# Patient Record
Sex: Male | Born: 1945 | Race: White | Hispanic: No | Marital: Married | Smoking: Former smoker
Health system: Southern US, Community
[De-identification: ages and names within clinical notes are randomized; demographics above are authoritative.]

## PROBLEM LIST (undated history)

## (undated) DIAGNOSIS — E7800 Pure hypercholesterolemia, unspecified: Secondary | ICD-10-CM

## (undated) DIAGNOSIS — M1990 Unspecified osteoarthritis, unspecified site: Secondary | ICD-10-CM

## (undated) DIAGNOSIS — I714 Abdominal aortic aneurysm, without rupture, unspecified: Secondary | ICD-10-CM

## (undated) DIAGNOSIS — M5126 Other intervertebral disc displacement, lumbar region: Secondary | ICD-10-CM

## (undated) DIAGNOSIS — R202 Paresthesia of skin: Secondary | ICD-10-CM

## (undated) DIAGNOSIS — M5020 Other cervical disc displacement, unspecified cervical region: Secondary | ICD-10-CM

## (undated) DIAGNOSIS — F329 Major depressive disorder, single episode, unspecified: Secondary | ICD-10-CM

## (undated) DIAGNOSIS — Z8709 Personal history of other diseases of the respiratory system: Secondary | ICD-10-CM

## (undated) DIAGNOSIS — F32A Depression, unspecified: Secondary | ICD-10-CM

## (undated) DIAGNOSIS — I10 Essential (primary) hypertension: Secondary | ICD-10-CM

## (undated) DIAGNOSIS — G4730 Sleep apnea, unspecified: Secondary | ICD-10-CM

## (undated) DIAGNOSIS — Z8701 Personal history of pneumonia (recurrent): Secondary | ICD-10-CM

## (undated) DIAGNOSIS — H269 Unspecified cataract: Secondary | ICD-10-CM

## (undated) DIAGNOSIS — R200 Anesthesia of skin: Secondary | ICD-10-CM

## (undated) DIAGNOSIS — J302 Other seasonal allergic rhinitis: Secondary | ICD-10-CM

## (undated) HISTORY — PX: ESOPHAGOGASTRODUODENOSCOPY: SHX1529

## (undated) HISTORY — PX: HAMMER TOE SURGERY: SHX385

## (undated) HISTORY — PX: REPLACEMENT TOTAL KNEE BILATERAL: SUR1225

## (undated) HISTORY — PX: EYE SURGERY: SHX253

## (undated) HISTORY — PX: REPLACEMENT TOTAL KNEE: SUR1224

## (undated) HISTORY — PX: COLONOSCOPY: SHX174

---

## 2003-01-17 ENCOUNTER — Inpatient Hospital Stay (HOSPITAL_COMMUNITY): Admission: RE | Admit: 2003-01-17 | Discharge: 2003-01-20 | Payer: Self-pay | Admitting: Orthopedic Surgery

## 2004-01-16 ENCOUNTER — Ambulatory Visit (HOSPITAL_COMMUNITY): Admission: RE | Admit: 2004-01-16 | Discharge: 2004-01-16 | Payer: Self-pay | Admitting: Orthopedic Surgery

## 2004-01-16 ENCOUNTER — Ambulatory Visit (HOSPITAL_BASED_OUTPATIENT_CLINIC_OR_DEPARTMENT_OTHER): Admission: RE | Admit: 2004-01-16 | Discharge: 2004-01-16 | Payer: Self-pay | Admitting: Orthopedic Surgery

## 2005-10-23 ENCOUNTER — Ambulatory Visit: Payer: Self-pay | Admitting: Unknown Physician Specialty

## 2007-01-05 ENCOUNTER — Inpatient Hospital Stay (HOSPITAL_COMMUNITY): Admission: RE | Admit: 2007-01-05 | Discharge: 2007-01-08 | Payer: Self-pay | Admitting: Orthopedic Surgery

## 2007-01-07 ENCOUNTER — Encounter (INDEPENDENT_AMBULATORY_CARE_PROVIDER_SITE_OTHER): Payer: Self-pay | Admitting: Orthopedic Surgery

## 2007-01-07 ENCOUNTER — Ambulatory Visit: Payer: Self-pay | Admitting: Vascular Surgery

## 2007-01-09 ENCOUNTER — Ambulatory Visit: Payer: Self-pay | Admitting: Orthopedic Surgery

## 2010-06-12 NOTE — Op Note (Signed)
NAME:  Maxwell Carroll, Maxwell Carroll                 ACCOUNT NO.:  [id number]   MEDICAL RECORD NO.:  [id number]          PATIENT TYPE:  INP   LOCATION:  2550                         FACILITY:  [facility]   PHYSICIAN:  Feliberto Gottron. Turner Daniels, M.D.   DATE OF BIRTH:  January 26, 1946   DATE OF PROCEDURE:  01/05/2007  DATE OF DISCHARGE:                               OPERATIVE REPORT   PREOPERATIVE DIAGNOSIS:  End-stage arthritis right knee.   POSTOPERATIVE DIAGNOSIS:  End-stage arthritis right knee.   PROCEDURE:  Right total knee arthroplasty using Osteonics Scorpio  components, 13 femur, 13 tibia, 10 mm Scorpio spacer high flex and a 11  patellar button.  All components cemented with a double batch of  Howmedica Simplex cement with 1500 mg Zinacef in the cement.   SURGEON:  Feliberto Gottron.  Turner Daniels, MD   FIRST ASSISTANT:  Skip Mayer PA-C.   ANESTHETIC:  General endotracheal.   ESTIMATED BLOOD LOSS:  Minimal.   FLUID REPLACEMENT:  1200 mL crystalloid.   DRAINS PLACED:  Two medium hemostats.   TOURNIQUET TIME:  1 hour 25 minutes.   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  Plain radiographs show him to be down to  bone-on-bone especially at the patellofemoral joint and medially.  He  has failed conservative treatment with anti-inflammatory medicines,  physical therapy, activity modification and now desires right total knee  arthroplasty in the hopes that he gets a result like he has on the left.  Risks and benefits of surgery discussed, questions answered.   DESCRIPTION OF PROCEDURE:  The patient identified by armband, taken the  operating room at [facility].  Appropriate anesthetic monitors  were attached and general endotracheal anesthesia  induced with the  patient in supine position.  He then received 2 grams of Ancef IV  actually got that in holding area.  Tourniquet was applied high to the  right thigh and lateral post and foot positioner applied to the right  side of the table and the right lower extremity prepped and draped in  usual sterile fashion from the ankle to the tourniquet.  The tourniquet  was inflated to 350 mmHg after wrapping the limb with an Esmarch.  Began  the procedure by making anterior midline incision about 20 cm in length  centered over the patella cutting through the skin and subcutaneous  tissue down to the transverse retinaculum over the patella.  This was  reflected medially allowing for a medial parapatellar arthrotomy.  The  patella was everted, prepatellar fat pad resected with electrocautery.  Superficial medial collateral ligament was elevated from anterior to  posterior leaving intact distally off the tibia over a length of about 4  to 5 cm.  The patella was everted, the knee was hyperflexed exposing the  cruciate ligaments which were then resected with electrocautery as well  as fairly impressive peripheral and notch osteophytes.  These were  removed with an osteotome.  A posterior medial Z retractor was then  placed.  A Homan retractor laterally and a Michael retractor through the  notch.  We then instrumented the proximal tibia with the Osteonics step  drill followed by the intramedullary rod and a 0 degrees posterior slope  cutting guide.  The cut was set up to remove about 6 mm of bone  medially, 8 mm of bone laterally because of the varus configuration of  the knee.  This was accomplished with a power saw after pinning the  cutting guide in place.  We then entered the distal femur 2 mm anterior  to the PCL origin with the intramedullary rod and a 5 degrees right  distal femoral cutting guide was then fixed to the femur, allowing for  10 mm resection from the high point.  We then  sized for a 13 femoral  component and pinned the cutting guide in 3 degrees of external rotation  and performed our anterior-posterior chamfer cuts and Scorpio cuts  without difficulty.  The everted patella was then grasped with patellar  cutting guide and the posterior 10 mm of the patella resected, sized for  #11 patellar component and drilled.  Once again the knee was  hyperflexed.  We sized for a 13 tibial baseplate.  This was pinned in  place so that the center was over the second metatarsal ray and we then  performed our Delta fin keel punches up to 11/13 cemented punch.  At  this point a trial 13 right femoral component was hammered into place.  On the tibial side a 13 trial baseplate with a 10 mm spacer was  inserted.  The knee came to full flexion, full extension and flexed to  130 degrees without restriction and the trial patellar button tracked  normally.  All trial components were then removed, all bony surfaces  were water picked clean, dried with suction and sponges, a double batch  of Howmedica simplex cement with 1500 mg Zinacef was mixed and then  applied to all bony metallic mating surfaces.  In order we hammered into  place a 13 tibial baseplate and removed excess cement, a 13 right  femoral component, removed excess cement and then the 11 mm patellar  button was clamped into place and excess cement removed.  We then  snapped into position the 10 mm bearing, with the 13 base plate.  The  knee was then reduced and held in 5 degrees of flexion with compression  as the cement hardened.  The clamp was removed from the patella.  We  checked tracking one more time.  It was excellent.  Medium Hemovac  drains were placed deep in the wound which was then closed with running  #1 Vicryl suture after one more pass of the pulse lavage.  The  subcutaneous tissue was closed with 0-0 and 2-0 undyed Vicryl suture and  the skin with skin staples.  A dressing of Xeroform, 4x4 dressing   sponges, Webril and Ace wrap applied.  Tourniquet let down.  The patient  was awakened and taken to the recovery room without difficulty.      Feliberto Gottron. Turner Daniels, M.D.  Electronically Signed     FJR/MEDQ  D:  01/05/2007  T:  01/05/2007  Job:  [id number]

## 2010-06-15 NOTE — Discharge Summary (Signed)
NAME:  CYPRESS, FANFAN                             ACCOUNT NO.:  [id number]   MEDICAL RECORD NO.:  [id number]                   PATIENT TYPE:  INP   LOCATION:  5010                                 FACILITY:  [facility]   PHYSICIAN:  Feliberto Gottron. Turner Daniels, M.D.                DATE OF BIRTH:  [birth year]   DATE OF ADMISSION:  01/17/2003  DATE OF DISCHARGE:  01/20/2003                                 DISCHARGE SUMMARY   PRIMARY DIAGNOSIS:  End-stage degenerative joint disease of the left knee.   PROCEDURES:  Left total-knee arthroplasty.   HISTORY OF PRESENT ILLNESS:  The patient is a 65 year old man injured at  work, made worse by a twisting injury of September 08, 2000.  Scope in 2002,  showed positive degenerative changes.  The patient has now failed  conservative treatments including nonsteroidal anti inflammatories, pain  medications, rest, PT, Synvisc injection and bracing.  He wishes to proceed  with left total-knee arthroplasty after discussion of the risk versus  benefits.   The patient has from 15 to 20 degree flexion contracture, with difficulty  ambulating, and is miserable.  He wishes to proceed after all discussions.   MEDICATIONS AT THE TIME OF ADMISSION:  1. Bextra.  2. Vicodin.  3. ___________.   ALLERGIES:  No known drug allergies.   PAST MEDICAL HISTORY:  Usual childhood illness, adult history arthritis.   SURGICAL HISTORY:  Left knee scope in 2002, sinus surgery in 1985, and  difficulties.   SOCIAL HISTORY:  No tobacco, occasional ethanol.  No IV drug abuse.  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   FAMILY HISTORY:  Mother is alive at 38 with positive arthritis.  Father died  at 62.   REVIEW OF SYSTEMS:  Shows positive glasses, no dentures. Denies any  shortness of breath, chest pain or recent illness.   PHYSICAL EXAMINATION:  GENERAL:  The patient is a 6 feet, 1 inch, 285 pound  male.  VITALS INCLUDE:  Temperature 97.5, pulse 92,  respirations 20, blood pressure  158/94.  HEENT:  Head normocephalic, atraumatic.  Ears:  TM's are clear.  Eyes:  Pupils are equal, round and reactive to light and accommodation.  NECK:  Supple.  Full range of motion.  CHEST:  Clear to auscultation and percussion.  HEART:  Regular rate and rhythm.  ABDOMEN:  Soft, nontender, no masses.  Bowel sounds are 2+.  EXTREMITIES:  Left knee has a 10 degree flexion contracture with crepitance.  Trace effusion.  Range of motion 10 to 100 degrees with pain throughout.  SKIN:  Intact.   STUDIES:  X-rays showed bone on bone changes in the medial compartment of  the left knee.   LABORATORY DATA:  Preoperative labs including CBC, CMET, chest x-ray, EKG,  PT and PTT are all within normal limits with the exception of EKG which  did  show possible anterior infarct, age undetermined.  The remainder of the  patient's labs are within normal limits.   HOSPITAL COURSE:  On the day of admission, the patient was taken to the  operating room at [facility] where he underwent a left total-knee  arthroplasty , a number 13 left femur,  number 13 tibial plate, 10 mm  __________ spacer, a number 11 modified medial patellar button___________,  methylmethacrylate cement with 1500 mg of Zinacef imbedded.  Hemovac drain  was placed to the knee.   The patient was placed on perioperative antibiotics for the first 48 hours,  placed on postoperative prophylaxis, __________ postoperatively.  He was  begun on the PCA Dilaudid pump for pain control postoperatively.  Physical  therapy was begun the day of surgery using CPM.   On postoperative day number 1, the patient was without complaint.  He was  out of bed with physical therapy, eating, drinking and voiding without  difficulty.  He was afebrile.  Vital signs were stable.  He was alert and  oriented.  Hemoglobin 11.1, INR 1.1, dressing was dry.  He was  neurovascularly intact to light touch and motor.  The drain  was discontinued  without difficulty.  He continued physical therapy, and PCA was  discontinued.   On postoperative day number 2, the patient was awake and alert, in moderate  pain.  Vital signs were stable.  Hemoglobin was 10.3.  PT 15.3.  Neurovascularly intact.  Otherwise making steady progress.  Continue with  physical therapy and occupational therapy.   On postoperative day number 3, the patient was without complaints.  He was  taking p.o. and voiding without difficulty, ambulating well, over 300 feet  with walker.  Vital signs were stable.  He was afebrile.  Hemoglobin 10.3,  INR 1.6.  Dressing was clean.  Wound was benign.  Abdomen was soft and  nontender.  He was otherwise hemodynamically stable and orthopedically  stable, and improved compared with prior to surgery.  He was discharged home  to the care of his family.   DISCHARGE INSTRUCTIONS:  1. Weightbearing as tolerated with walker and crutches.  Elevate the knee     with ice.  2. Home Health physical therapy for ___________ and CPM.  3. Diet regular.  4. Wound care:  Keep clean and dry, may shower on postoperative day number     7.   DISCHARGE MEDICATIONS:  1. The patient will continue on Coumadin per pharmacy protocol.  2. He was given medication prescriptions for Percocet and Robaxin.   FOLLOWUP:  He will follow up with Dr. Turner Daniels in 7-10 days for followup check.   CONDITION ON DISCHARGE:  Stable and improved.      Laural Benes. Jannet Mantis.                     Feliberto Gottron. Turner Daniels, M.D.    Merita Norton  D:  03/14/2003  T:  03/14/2003  Job:  [id number]

## 2010-06-15 NOTE — Discharge Summary (Signed)
NAME:  Maxwell Carroll, Maxwell Carroll                 ACCOUNT NO.:  [id number]   MEDICAL RECORD NO.:  [id number]          PATIENT TYPE:  INP   LOCATION:  5003                         FACILITY:  [facility]   PHYSICIAN:  Feliberto Gottron. Turner Daniels, M.D.   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 He previously had a left total  knee done and is doing well and desires right total knee arthroplasty.  He has end stage arthritic changes of the knee.   HISTORY OF PRESENT ILLNESS:  Again, the patient had left knee replaced a  year or two ago. Is doing well and now desires the same for the right  knee, which has bone-on-bone arthritic changes. He is well aware of the  risks and benefits of surgery and wants to proceed.   PAST MEDICAL HISTORY:  NO KNOWN DRUG ALLERGIES.   MEDICATIONS:  Include Prozac, vitamins, Vicodin, and Quinine.   PAST MEDICAL HISTORY:  Degenerative arthritis and hypertension.   PAST SURGICAL HISTORY:  1. Left knee scope 2002.  2. Sinus surgery in 1995.   SOCIAL HISTORY:  He does not smoke. He has about 1 drink of alcohol  daily. 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   FAMILY HISTORY:  Positive for his mother who alive at 59. His father  died at 12. He does wear glasses.   REVIEW OF SYSTEMS:  Denies any shortness of breath, chest pain, or  recent illness.   PHYSICAL EXAMINATION:  VITAL SIGNS:  Height 6 feet 1 inches tall. Weight  286 pounds.  EXTREMITIES:  Left total knee scar is well healed. Previous size  included a 13 femur, 13 tibia, #11 patella, 10 mm Swiss Scorpio spacer.  The right knee has end stage arthritis. Range of motion  is 5 to 95  degrees. The skin on the right knee is in good shape.  NEUROLOGIC:  He is otherwise, neurovascularly intact and prepared for  surgical intervention.   HOSPITAL COURSE:  The patient was admitted through pre-admissions on  January 05, 2007. All of his preoperative  labs were in order. He was  taken to the operating room where he underwent right total knee  arthroplasty using a 13 tibial component, 13 right femoral component, 10  mm Scorpio spacer, and a 13 patellar component. Postoperatively, he did  well. First day his hemoglobin was 13.3. He was begun ambulation and  wear bearing as tolerated. We also used a CPM machine. He was seen by  physical therapy and occupational therapy and as he did on his previous  visit, did well with that. He advanced rapidly with physical therapy the  next day and was out in the hallway. His hemoglobin had dropped down to  10.3. He remained afebrile. His wound was clean and dry. His drains were  removed on the first postoperative day and on the third postoperative  day,  he had passed all of his physical therapy and was prepared for  discharge. He did receive home health consult for CPM, maintenance of  his Coumadin for 2 weeks, and was discharged home on Percocet for pain  control and Coumadin on a sliding scale dose adjusted to maintain his  INR between 1.5 and 2. His final hemoglobin was 9.6. White count was 6.5  and at discharge, his range of motion, again only on the third  postoperative day was 0 to 65.   DISPOSITION:  The patient was discharged home on the 11th.   FOLLOWUP:  Return to see me in the office for followup check.   FINAL DIAGNOSES:  End stage arthritis of the right knee.   DISCHARGE MEDICATIONS:  Include those on his home requisition sheet with  the addition of Percocet 5 mg 1 to 2 p.o. q.4 hours p.r.n., #16 with no  refills and Coumadin take as directed, based on a sliding scale.      Feliberto Gottron. Turner Daniels, M.D.  Electronically Signed     FJR/MEDQ  D:  02/19/2007  T:  02/20/2007  Job:  [id number]

## 2010-06-15 NOTE — Op Note (Signed)
NAMEJABRON, WEESE NO.:  [id number]   MEDICAL RECORD NO.:  [id number]                   PATIENT TYPE:  INP   LOCATION:  2887                                 FACILITY:  [facility]   PHYSICIAN:  Feliberto Gottron. Turner Daniels, M.D.                DATE OF BIRTH:  06-11-1945   DATE OF PROCEDURE:  01/17/2003  DATE OF DISCHARGE:                                 OPERATIVE REPORT   PREOPERATIVE DIAGNOSIS:  End-stage arthritis, left knee, with a 15-degree  flexion contracture.   POSTOPERATIVE DIAGNOSIS:  End-stage arthritis, left knee, with a 15-degree  flexion contracture.   PROCEDURE:  Left total knee arthroplasty using Osteonics Scorpio components,  13 left femur, 13 tibial base plate, 10 PS spacer, 11 modified medial  patellar button, all components cemented, a double batch of Palacos  polymethyl methacrylate cement with 1500 mg of Zinacef.   SURGEON:  Feliberto Gottron. Turner Daniels, M.D.   ASSISTANTLaural Benes. Su Hilt, P.A.-C.   ANESTHESIA:  General endotracheal.   ESTIMATED BLOOD LOSS:  Minimal.   FLUIDS REPLACED:  One liter of crystalloid.   DRAINS PLACED:  Two medium Hemovacs.   TOURNIQUET TIME:  One hour and 25 minutes.   INDICATIONS FOR PROCEDURE:  A 65 year old man injured at work some time ago,  who has failed conservative treatment with anti-inflammatory medicines,  physical therapy, arthroscopic debridement, and has end-stage arthritis of  his left knee.  He also has a 15-20 degree flexion contracture and has  difficulty ambulating, and is miserable.  He has also had Visco  supplementation without a positive result.  In any event, he is now prepared  for a total knee arthroplasty with bicompartmental if needed of the left  knee, medial and patellofemoral primarily.  The risks and benefits of  surgery understood by the patient.  He is prepared for surgical  intervention.   DESCRIPTION OF PROCEDURE:  The patient is identified by arm band and taken  to the operating  room at [facility]. [facility] and  appropriate anesthetic monitors were attached.  General endotracheal  anesthesia was induced with the patient in the supine position.  Lateral  posts applied to the table, as well as a foot positioner, and the left lower  extremity was prepped and draped in the usual sterile fashion from the ankle  to the tourniquet which was applied high to the thigh, after the patient had  a femoral nerve block done by anesthesia.  The full limb was wrapped with an  Esmarch bandage with the tourniquet inflated to 350 mmHg.  We began the  procedure by making an anterior midline incision, starting one hand breadth  above the patella, going below the patella, and a couple of cm distal to and  just medial to the tibial tubercle, through the skin and subcutaneous  tissue, down to the level of the transverse retinaculum  which was incised  over the patella and reflected medially, followed by a medial parapatellar  arthrotomy, leaving a cuff of tendon medial at the quad insertion.  We then  peeled the superficial medial collateral ligament off of the proximal tibia,  going from anterior to posterior, and leaving it intact distally over about  4.0 cm.  This allowed [place] to evert the patella, hyperflex the knee, exposing  the knee joint.  We then removed the anterior one-half of the medial and  lateral meniscus as well as the pre-patellar fat pad, and followed by  resection of the cruciate ligaments.  A Z-retractor was placed  posteromedial, a McCale retractor through the notch posteriorly, and a  lateral Hohmann retractor placed, exposing the proximal tibia, which was  then entered with the Osteonics step-drill in the center of the tibial  plateau.  This was followed by the intramedullary rod and a 0-degree  posterior slope cutting guide, allowing a resection of 4.0 mm medially and  about 10.0 mm laterally.  Satisfied with the proximal tibial resection, we  then  entered the distal femur with the step-drill 2.0 mm anterior to the PCL  origin, followed by the IM rod, and a 5-degree left 10.0 mm distal femoral  cutting guide was pinned into place, followed by a standard distal femoral  cut.  We sized for a #13 femoral component and rotated the pins three  degrees to allow for the tight medial flexion gap.  We then performed our  anterior, posterior and chamfer cuts without difficulty, followed by the  Scorpio box cut.  The everted patella was then grasped with the patellar  cutting guide and the posterior 10.0 mm of the patella resected, sized for a  #11 modified medial patellar button and drilled.  The knee was once again  hyperflexed.  We then placed a #13 left trial femur, a #13 trial tibial base  plate with a #16 PS spacer, and found excellent stability in full extension  and flexion of 120 degrees, and good patellar tracking, although a slight  lateral release was required.  At this point the trials were removed, after  marking the rotation of the tibia appropriately.  The knee was again  hyperflexed.  The proximal tibia was exposed with the Z, McCale and Hohmann  retractors.  The #13 tibial base plate was pinned into place where it had  the best coverage and rotation.  The Delta Fit keel punches taken up to an  11, 13 cemented punch.  At this point the knee was water-picked clean and  dried with suction and sponges.  A double batch of Palacos polymethyl  methacrylate cement with 1500 mg of Zinacef was mixed, applied to all bony  and metallic surfaces, except for the posterior condyles of the femur, and  in order we hammered into place a #13 tibial base plate, a #10 left femur, a  #10 PS spacer and a #11 modified medial patellar button was then clamped  into place and the excess cement removed.  The knee was then reduced and  tracking once again checked.  Once again the knee was water-picked clean. The medium Hemovac drains were placed deep in the  wound, after once again  checking for any excess bits of cement.  The parapatellar arthrotomy closed  with running #1 Vicryl suture, the subcutaneous tissue with #0 and #2-0  undyed Vicryl suture, and the skin with skin staples.  A dressing of  Xeroform, 4 x 4 dressings,  sponges, Webril and an Ace wrap were applied.  The patient was then awakened and taken to the recovery room without  difficulty.                                               Feliberto Gottron. Turner Daniels, M.D.    Ovid Curd  D:  01/17/2003  T:  01/17/2003  Job:  [id number]

## 2010-06-15 NOTE — Op Note (Signed)
NAME:  Maxwell Carroll, Maxwell Carroll                 ACCOUNT NO.:  [id number]   MEDICAL RECORD NO.:  [id number]          PATIENT TYPE:  AMB   LOCATION:  DSC                          FACILITY:  [facility]   PHYSICIAN:  Feliberto Gottron. Turner Daniels, M.D.   DATE OF BIRTH:  1946-01-28   DATE OF PROCEDURE:  01/16/2004  DATE OF DISCHARGE:                                 OPERATIVE REPORT   PREOPERATIVE DIAGNOSIS:  Recurrent hemarthrosis, left total knee.   POSTOPERATIVE DIAGNOSES:  1.  Recurrent hemarthrosis, left total knee.  2.  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   PROCEDURE:  Arthroscopic excision of Cyclops lesion and cautery of bleeder  at the base of the Cyclops lesion.   SURGEON:  Feliberto Gottron. Turner Daniels, M.D.   ASSISTANT:  None.   ANESTHESIA:  General LMA.   ESTIMATED BLOOD LOSS:  Minimal.   FLUID REPLACEMENT:  800 cc Crystalloid.   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  He had a total knee done (about one year  ago now), and spontaneously had the onset of a hemarthrosis just walking in  a store in August 2005.  The hemarthrosis has been aspirated three times  now, as much as 180 cc.  Gram stain and cultures have been sent off three  separate times, off of antibiotics and grown nothing.  Because of the  recurrent hemarthrosis, he is taken for arthroscopic evaluation and  treatment, in hopes that we will find what is causing the bleeding in his  knee.  Sometimes after the aspiration there is no bleeding for up to one  week, and then it suddenly comes back over a few hours.   DESCRIPTION OF PROCEDURE:  The patient was identified by arm band, taken to  the operating room at [facility].  Appropriate anesthetic  monitors were attached and general LMA anesthesia induced with the patient  in supine position.  Lateral posts were applied to the table, and the left  lower extremity prepped and draped in the usual sterile fashion  from the  ankle to the mid-thigh.   Using a #11 blade, standard anteromedial and inferolateral parapatellar  portals were then made, allowing introduction of the arthroscope through the  inferolateral portal and the outflow through the anteromedial portal.  Pump  pressure was kept anywhere from 40-70 mmHg.   No obvious bleeder was found in the suprapatellar pouch or in the medial and  lateral gutters of the knee.  Moving into the notch, we identified a Cyclops  lesion which had an obvious impression from the plastic post as the knee  came in to full extension.  When it was removed an obvious bleeder was found  at the base of the Cyclops lesion and cauterized, as was the smaller one.  We then took a 4.2 Grave-White sucker shaver and went around the medial and  lateral gutters, looking for any more obvious bleeders -- none were found.  Looking through the notch, no posterior bleeders were noted.   At this  point, the knee was washed out with normal saline solution.  After  having sent off the original effusion for Gram-stain and culture, the  patient did receive 1 g of Ancef intraoperatively.  Arthroscopic instruments  were removed.  A dressing of Xerofoam 4x4 dressing sponges, Webril and an  Ace wrap applied.  The patient was then awakened and taken to the recovery  room without difficulty.      Emilio Aspen  D:  01/16/2004  T:  01/17/2004  Job:  [id number]

## 2010-11-05 LAB — URINALYSIS, ROUTINE W REFLEX MICROSCOPIC
Bilirubin Urine: NEGATIVE
Glucose, UA: NEGATIVE
Hgb urine dipstick: NEGATIVE
Ketones, ur: NEGATIVE
Nitrite: NEGATIVE
Protein, ur: NEGATIVE
Specific Gravity, Urine: 1.023
Urobilinogen, UA: 0.2
pH: 6.5

## 2010-11-05 LAB — TYPE AND SCREEN
ABO/RH(D): A POS
Antibody Screen: NEGATIVE

## 2010-11-05 LAB — CBC
HCT: 27.9 — ABNORMAL LOW
HCT: 29.4 — ABNORMAL LOW
HCT: 34.7 — ABNORMAL LOW
HCT: 44.3
Hemoglobin: 10.3 — ABNORMAL LOW
Hemoglobin: 11.9 — ABNORMAL LOW
Hemoglobin: 15.1
Hemoglobin: 9.6 — ABNORMAL LOW
MCHC: 34.1
MCHC: 34.3
MCHC: 34.5
MCHC: 34.9
MCV: 88.7
MCV: 89.4
MCV: 89.9
MCV: 89.9
Platelets: 154
Platelets: 155
Platelets: 216
Platelets: 218
RBC: 3.1 — ABNORMAL LOW
RBC: 3.32 — ABNORMAL LOW
RBC: 3.88 — ABNORMAL LOW
RBC: 4.93
RDW: 12.6
RDW: 12.7
RDW: 12.8
RDW: 12.8
WBC: 11.2 — ABNORMAL HIGH
WBC: 6.5
WBC: 6.6
WBC: 7.1

## 2010-11-05 LAB — HEMOGLOBIN AND HEMATOCRIT, BLOOD
HCT: 38.4 — ABNORMAL LOW
HCT: 38.8 — ABNORMAL LOW
Hemoglobin: 13.3
Hemoglobin: 13.6

## 2010-11-05 LAB — COMPREHENSIVE METABOLIC PANEL
ALT: 20
AST: 20
Albumin: 4.2
Alkaline Phosphatase: 56
BUN: 10
CO2: 30
Calcium: 9.8
Chloride: 104
Creatinine, Ser: 0.89
GFR calc Af Amer: >60
GFR calc non Af Amer: >60
Glucose, Bld: 83
Potassium: 4.6
Sodium: 139
Total Bilirubin: 1.1
Total Protein: 7

## 2010-11-05 LAB — DIFFERENTIAL
Basophils Absolute: 0
Basophils Relative: 1
Eosinophils Absolute: 0.3
Eosinophils Relative: 5
Lymphocytes Relative: 32
Lymphs Abs: 2.1
Monocytes Absolute: 0.6
Monocytes Relative: 10
Neutro Abs: 3.5
Neutrophils Relative %: 53

## 2010-11-05 LAB — PREPARE RBC (CROSSMATCH)

## 2010-11-05 LAB — PROTIME-INR
INR: 1
INR: 1.1
INR: 1.4
INR: 1.9 — ABNORMAL HIGH
Prothrombin Time: 13
Prothrombin Time: 14.6
Prothrombin Time: 17.8 — ABNORMAL HIGH
Prothrombin Time: 22.6 — ABNORMAL HIGH

## 2010-11-05 LAB — ABO/RH: ABO/RH(D): A POS

## 2010-11-05 LAB — APTT: aPTT: 27

## 2012-07-01 ENCOUNTER — Other Ambulatory Visit: Payer: Self-pay | Admitting: Unknown Physician Specialty

## 2012-07-01 LAB — SYNOVIAL CELL COUNT + DIFF, W/ CRYSTALS
Basophil: 0 %
Crystals, Joint Fluid: NONE SEEN
Eosinophil: 5 %
Lymphocytes: 11 %
Neutrophils: 84 %
Nucleated Cell Count: 162 /mm3
Other Cells BF: 0 %
Other Mononuclear Cells: 0 %

## 2012-07-05 LAB — MISC AER/ANAEROBIC CULT.

## 2013-01-01 ENCOUNTER — Ambulatory Visit: Payer: Self-pay | Admitting: Internal Medicine

## 2013-01-29 ENCOUNTER — Ambulatory Visit: Payer: Self-pay | Admitting: Internal Medicine

## 2013-10-21 ENCOUNTER — Ambulatory Visit: Payer: Self-pay | Admitting: Internal Medicine

## 2014-01-19 ENCOUNTER — Ambulatory Visit: Payer: Self-pay | Admitting: Podiatry

## 2014-02-28 ENCOUNTER — Emergency Department: Payer: Self-pay | Admitting: Emergency Medicine

## 2014-05-02 ENCOUNTER — Ambulatory Visit
Admit: 2014-05-02 | Disposition: A | Discharge status: Home / Self Care | Payer: Self-pay | Attending: Unknown Physician Specialty | Admitting: Unknown Physician Specialty

## 2014-05-23 LAB — SURGICAL PATHOLOGY

## 2014-12-01 ENCOUNTER — Ambulatory Visit
Admission: RE | Admit: 2014-12-01 | Discharge: 2014-12-01 | Disposition: A | Discharge status: Home / Self Care | Payer: Medicare PPO | Source: Ambulatory Visit | Attending: Internal Medicine | Admitting: Internal Medicine

## 2014-12-01 ENCOUNTER — Other Ambulatory Visit: Payer: Self-pay | Admitting: Internal Medicine

## 2014-12-01 DIAGNOSIS — K5730 Diverticulosis of large intestine without perforation or abscess without bleeding: Secondary | ICD-10-CM | POA: Diagnosis not present

## 2014-12-01 DIAGNOSIS — I714 Abdominal aortic aneurysm, without rupture, unspecified: Secondary | ICD-10-CM

## 2014-12-01 DIAGNOSIS — I7090 Unspecified atherosclerosis: Secondary | ICD-10-CM | POA: Insufficient documentation

## 2014-12-01 DIAGNOSIS — M545 Low back pain, unspecified: Secondary | ICD-10-CM

## 2014-12-01 DIAGNOSIS — K760 Fatty (change of) liver, not elsewhere classified: Secondary | ICD-10-CM | POA: Insufficient documentation

## 2014-12-01 HISTORY — DX: Essential (primary) hypertension: I10

## 2014-12-01 LAB — POCT I-STAT CREATININE: Creatinine, Ser: 1 mg/dL (ref 0.61–1.24)

## 2014-12-01 MED ORDER — IOHEXOL 350 MG/ML SOLN
125.0000 mL | Freq: Once | INTRAVENOUS | Status: AC | PRN
  Administered 2014-12-01: 125 mL via INTRAVENOUS

## 2014-12-06 ENCOUNTER — Ambulatory Visit: Payer: Self-pay | Admitting: Urology

## 2015-01-04 ENCOUNTER — Other Ambulatory Visit: Payer: Self-pay | Admitting: Internal Medicine

## 2015-01-04 DIAGNOSIS — M545 Low back pain, unspecified: Secondary | ICD-10-CM

## 2015-02-01 ENCOUNTER — Other Ambulatory Visit: Payer: Self-pay | Admitting: Neurological Surgery

## 2015-02-01 DIAGNOSIS — M5412 Radiculopathy, cervical region: Secondary | ICD-10-CM

## 2015-02-16 ENCOUNTER — Other Ambulatory Visit: Payer: Self-pay | Admitting: Neurological Surgery

## 2015-02-16 DIAGNOSIS — G930 Cerebral cysts: Secondary | ICD-10-CM

## 2015-02-22 ENCOUNTER — Ambulatory Visit
Admission: RE | Admit: 2015-02-22 | Discharge: 2015-02-22 | Disposition: A | Discharge status: Home / Self Care | Payer: PRIVATE HEALTH INSURANCE | Source: Ambulatory Visit | Attending: Neurological Surgery | Admitting: Neurological Surgery

## 2015-02-22 DIAGNOSIS — G930 Cerebral cysts: Secondary | ICD-10-CM

## 2015-02-22 MED ORDER — GADOBENATE DIMEGLUMINE 529 MG/ML IV SOLN
20.0000 mL | Freq: Once | INTRAVENOUS | Status: AC | PRN
  Administered 2015-02-22: 20 mL via INTRAVENOUS

## 2015-02-24 ENCOUNTER — Other Ambulatory Visit: Payer: Self-pay | Admitting: Neurological Surgery

## 2015-03-06 ENCOUNTER — Emergency Department (HOSPITAL_COMMUNITY): Payer: Medicare Other

## 2015-03-06 ENCOUNTER — Emergency Department (HOSPITAL_COMMUNITY)
Admission: EM | Admit: 2015-03-06 | Discharge: 2015-03-06 | Disposition: A | Discharge status: Home / Self Care | Payer: Medicare Other | Attending: Emergency Medicine | Admitting: Emergency Medicine

## 2015-03-06 ENCOUNTER — Encounter (HOSPITAL_COMMUNITY): Payer: Self-pay

## 2015-03-06 ENCOUNTER — Emergency Department
Admission: EM | Admit: 2015-03-06 | Discharge: 2015-03-06 | Disposition: A | Discharge status: Home / Self Care | Payer: Medicare Other | Attending: Emergency Medicine | Admitting: Emergency Medicine

## 2015-03-06 ENCOUNTER — Encounter: Payer: Self-pay | Admitting: *Deleted

## 2015-03-06 DIAGNOSIS — R05 Cough: Secondary | ICD-10-CM | POA: Diagnosis not present

## 2015-03-06 DIAGNOSIS — R0602 Shortness of breath: Secondary | ICD-10-CM | POA: Insufficient documentation

## 2015-03-06 DIAGNOSIS — I10 Essential (primary) hypertension: Secondary | ICD-10-CM | POA: Insufficient documentation

## 2015-03-06 DIAGNOSIS — Z87891 Personal history of nicotine dependence: Secondary | ICD-10-CM | POA: Diagnosis not present

## 2015-03-06 HISTORY — DX: Unspecified cataract: H26.9

## 2015-03-06 HISTORY — DX: Pure hypercholesterolemia, unspecified: E78.00

## 2015-03-06 HISTORY — DX: Unspecified osteoarthritis, unspecified site: M19.90

## 2015-03-06 HISTORY — DX: Sleep apnea, unspecified: G47.30

## 2015-03-06 LAB — CBC WITH DIFFERENTIAL/PLATELET
BASOS ABS: 0 10*3/uL (ref 0.0–0.1)
Basophils Relative: 0 %
EOS ABS: 0.4 10*3/uL (ref 0.0–0.7)
EOS PCT: 5 %
HCT: 44.7 % (ref 39.0–52.0)
Hemoglobin: 14.7 g/dL (ref 13.0–17.0)
LYMPHS ABS: 1.8 10*3/uL (ref 0.7–4.0)
Lymphocytes Relative: 20 %
MCH: 30 pg (ref 26.0–34.0)
MCHC: 32.9 g/dL (ref 30.0–36.0)
MCV: 91.2 fL (ref 78.0–100.0)
MONO ABS: 0.8 10*3/uL (ref 0.1–1.0)
Monocytes Relative: 9 %
Neutro Abs: 6.1 10*3/uL (ref 1.7–7.7)
Neutrophils Relative %: 66 %
PLATELETS: 240 10*3/uL (ref 150–400)
RBC: 4.9 MIL/uL (ref 4.22–5.81)
RDW: 12.6 % (ref 11.5–15.5)
WBC: 9.2 10*3/uL (ref 4.0–10.5)

## 2015-03-06 LAB — COMPREHENSIVE METABOLIC PANEL
ALT: 26 U/L (ref 17–63)
AST: 22 U/L (ref 15–41)
Albumin: 4 g/dL (ref 3.5–5.0)
Alkaline Phosphatase: 60 U/L (ref 38–126)
Anion gap: 13 (ref 5–15)
BUN: 15 mg/dL (ref 6–20)
CHLORIDE: 97 mmol/L — AB (ref 101–111)
CO2: 27 mmol/L (ref 22–32)
CREATININE: 0.88 mg/dL (ref 0.61–1.24)
Calcium: 9 mg/dL (ref 8.9–10.3)
Glucose, Bld: 97 mg/dL (ref 65–99)
POTASSIUM: 4.6 mmol/L (ref 3.5–5.1)
SODIUM: 137 mmol/L (ref 135–145)
Total Bilirubin: 0.8 mg/dL (ref 0.3–1.2)
Total Protein: 7.3 g/dL (ref 6.5–8.1)

## 2015-03-06 LAB — I-STAT TROPONIN, ED: TROPONIN I, POC: 0 ng/mL (ref 0.00–0.08)

## 2015-03-06 NOTE — ED Notes (Signed)
Pt reports SOB and productive cough of yellow "heavy" mucus, onset yesterday. He sleeps with CPAP at home and the last two nights he reports it is not helping, "its like I'm not getting enough air in." Denies any pain.

## 2015-03-06 NOTE — ED Notes (Addendum)
Pt ambulatory to triage.  Pt has sob and coughing up yellow phlegm.  Sx since yesterday.  Pt scheduled for brain surgery at [place] 03-15-15.  [facility] but did not see a provider due to 5 hour wait time.  Pt denies chest pain. Pt alert.  Speech clear.   Pt reports labs, ekg and cxr done tonight at [place] and pt has stickers and gauze on from needle stick.  However, no labs or er visit found on recent visits.  Dr Corky Downs aware.  Pt decided to leave and not be seen in er tonight and will go to doctor tomorrow morning.

## 2015-03-07 ENCOUNTER — Encounter: Payer: Self-pay | Admitting: *Deleted

## 2015-03-08 ENCOUNTER — Inpatient Hospital Stay (HOSPITAL_COMMUNITY): Admission: RE | Admit: 2015-03-08 | Payer: PRIVATE HEALTH INSURANCE | Source: Ambulatory Visit

## 2015-03-08 ENCOUNTER — Emergency Department (HOSPITAL_COMMUNITY): Payer: Medicare Other

## 2015-03-08 ENCOUNTER — Encounter (HOSPITAL_COMMUNITY): Payer: Medicare Other

## 2015-03-08 ENCOUNTER — Emergency Department (HOSPITAL_COMMUNITY)
Admission: EM | Admit: 2015-03-08 | Discharge: 2015-03-08 | Disposition: A | Discharge status: Home / Self Care | Payer: Medicare Other | Attending: Emergency Medicine | Admitting: Emergency Medicine

## 2015-03-08 ENCOUNTER — Encounter (HOSPITAL_COMMUNITY): Payer: Self-pay | Admitting: Family Medicine

## 2015-03-08 ENCOUNTER — Other Ambulatory Visit: Payer: Self-pay

## 2015-03-08 DIAGNOSIS — Z8739 Personal history of other diseases of the musculoskeletal system and connective tissue: Secondary | ICD-10-CM | POA: Diagnosis not present

## 2015-03-08 DIAGNOSIS — Z8669 Personal history of other diseases of the nervous system and sense organs: Secondary | ICD-10-CM | POA: Insufficient documentation

## 2015-03-08 DIAGNOSIS — R05 Cough: Secondary | ICD-10-CM | POA: Diagnosis present

## 2015-03-08 DIAGNOSIS — Z87891 Personal history of nicotine dependence: Secondary | ICD-10-CM | POA: Insufficient documentation

## 2015-03-08 DIAGNOSIS — E7800 Pure hypercholesterolemia, unspecified: Secondary | ICD-10-CM | POA: Insufficient documentation

## 2015-03-08 DIAGNOSIS — Z79899 Other long term (current) drug therapy: Secondary | ICD-10-CM | POA: Diagnosis not present

## 2015-03-08 DIAGNOSIS — I10 Essential (primary) hypertension: Secondary | ICD-10-CM | POA: Insufficient documentation

## 2015-03-08 DIAGNOSIS — J209 Acute bronchitis, unspecified: Secondary | ICD-10-CM | POA: Insufficient documentation

## 2015-03-08 HISTORY — DX: Other seasonal allergic rhinitis: J30.2

## 2015-03-08 HISTORY — DX: Other cervical disc displacement, unspecified cervical region: M50.20

## 2015-03-08 HISTORY — DX: Abdominal aortic aneurysm, without rupture, unspecified: I71.40

## 2015-03-08 HISTORY — DX: Personal history of other diseases of the respiratory system: Z87.09

## 2015-03-08 HISTORY — DX: Personal history of pneumonia (recurrent): Z87.01

## 2015-03-08 HISTORY — DX: Paresthesia of skin: R20.2

## 2015-03-08 HISTORY — DX: Abdominal aortic aneurysm, without rupture: I71.4

## 2015-03-08 HISTORY — DX: Other intervertebral disc displacement, lumbar region: M51.26

## 2015-03-08 HISTORY — DX: Major depressive disorder, single episode, unspecified: F32.9

## 2015-03-08 HISTORY — DX: Depression, unspecified: F32.A

## 2015-03-08 HISTORY — DX: Anesthesia of skin: R20.0

## 2015-03-08 LAB — BASIC METABOLIC PANEL
ANION GAP: 12 (ref 5–15)
BUN: 13 mg/dL (ref 6–20)
CHLORIDE: 100 mmol/L — AB (ref 101–111)
CO2: 29 mmol/L (ref 22–32)
Calcium: 9.4 mg/dL (ref 8.9–10.3)
Creatinine, Ser: 0.89 mg/dL (ref 0.61–1.24)
GFR calc Af Amer: >60 mL/min (ref >60)
Glucose, Bld: 115 mg/dL — ABNORMAL HIGH (ref 65–99)
POTASSIUM: 4.6 mmol/L (ref 3.5–5.1)
SODIUM: 141 mmol/L (ref 135–145)

## 2015-03-08 LAB — BRAIN NATRIURETIC PEPTIDE: B NATRIURETIC PEPTIDE 5: 15 pg/mL (ref 0.0–100.0)

## 2015-03-08 LAB — CBC
HEMATOCRIT: 46.7 % (ref 39.0–52.0)
HEMOGLOBIN: 15.6 g/dL (ref 13.0–17.0)
MCH: 30.7 pg (ref 26.0–34.0)
MCHC: 33.4 g/dL (ref 30.0–36.0)
MCV: 91.9 fL (ref 78.0–100.0)
Platelets: 238 10*3/uL (ref 150–400)
RBC: 5.08 MIL/uL (ref 4.22–5.81)
RDW: 12.7 % (ref 11.5–15.5)
WBC: 10.2 10*3/uL (ref 4.0–10.5)

## 2015-03-08 LAB — TYPE AND SCREEN
ABO/RH(D): A POS
ANTIBODY SCREEN: NEGATIVE

## 2015-03-08 LAB — I-STAT TROPONIN, ED: Troponin i, poc: 0 ng/mL (ref 0.00–0.08)

## 2015-03-08 MED ORDER — AEROCHAMBER PLUS W/MASK MISC
1.0000 | Freq: Once | Status: AC
  Administered 2015-03-08: 1
  Filled 2015-03-08: qty 1

## 2015-03-08 MED ORDER — ALBUTEROL SULFATE HFA 108 (90 BASE) MCG/ACT IN AERS
2.0000 | INHALATION_SPRAY | RESPIRATORY_TRACT | Status: DC | PRN
Start: 2015-03-08 — End: 2015-03-08
  Administered 2015-03-08: 2 via RESPIRATORY_TRACT
  Filled 2015-03-08: qty 6.7

## 2015-03-08 MED ORDER — ALBUTEROL SULFATE (2.5 MG/3ML) 0.083% IN NEBU
5.0000 mg | INHALATION_SOLUTION | Freq: Once | RESPIRATORY_TRACT | Status: AC
  Administered 2015-03-08: 5 mg via RESPIRATORY_TRACT
  Filled 2015-03-08: qty 6

## 2015-03-08 NOTE — Progress Notes (Signed)
PCP - Dr. Verdell Carmine Cardiologist - Dr. Lujean Amel  EKG - 03/08/15 CXR - 03/08/15  Echo/stress test- requested from Dr. Clayborn Bigness Cardiac Cath - denies  Patient was seen in the ED for shortness of breath and was diagnosed with acute bronchitis.  Patient states that since he received the nebulizer treatment, he feels much better.    Patient denies chest pain.

## 2015-03-08 NOTE — ED Notes (Signed)
Pt back from Xray, in room, and hooked up to monitor.

## 2015-03-08 NOTE — Pre-Procedure Instructions (Signed)
    Maxwell Carroll  03/08/2015      [id number] - [place], [place] - [address] [place] [address] Phone: [phone] Fax: [phone]    Your procedure is scheduled on Wednesday, February 15th, 2017.  Report to [facility] Admitting at 6:30 A.M.  Call this number if you have problems the morning of surgery:  [phone]   Remember:  Do not eat food or drink liquids after midnight.   Take these medicines the morning of surgery with A SIP OF WATER: Fluoxetine (Prozac).  Stop taking: Aspirin, NSAIDS, Aleve, Naproxen, Ibuprofen, Advil, Motrin, BC's, Goody's, Fish oil, all herbal medications, and all vitamins.    Do not wear jewelry.  Do not wear lotions, powders, or colognes.  You may wear deodorant.  Men may shave face and neck.  Do not bring valuables to the hospital.  [facility] is not responsible for any belongings or valuables.  Contacts, dentures or bridgework may not be worn into surgery.  Leave your suitcase in the car.  After surgery it may be brought to your room.  For patients admitted to the hospital, discharge time will be determined by your treatment team.  Patients discharged the day of surgery will not be allowed to drive home.   Special instructions:  See attached.   Please read over the following fact sheets that you were given. Pain Booklet, Coughing and Deep Breathing, Blood Transfusion Information and Surgical Site Infection Prevention

## 2015-03-08 NOTE — Discharge Instructions (Signed)
Acute Bronchitis Use your albuterol inhaler 2 puffs every 4 hours as needed for cough or shortness of breath. Return if needed more than every 4 hours or see your primary care physician. Bronchitis is inflammation of the airways that extend from the windpipe into the lungs (bronchi). The inflammation often causes mucus to develop. This leads to a cough, which is the most common symptom of bronchitis.  In acute bronchitis, the condition usually develops suddenly and goes away over time, usually in a couple weeks. Smoking, allergies, and asthma can make bronchitis worse. Repeated episodes of bronchitis may cause further lung problems.  CAUSES Acute bronchitis is most often caused by the same virus that causes a cold. The virus can spread from person to person (contagious) through coughing, sneezing, and touching contaminated objects. SIGNS AND SYMPTOMS   Cough.   Fever.   Coughing up mucus.   Body aches.   Chest congestion.   Chills.   Shortness of breath.   Sore throat.  DIAGNOSIS  Acute bronchitis is usually diagnosed through a physical exam. Your health care provider will also ask you questions about your medical history. Tests, such as chest X-rays, are sometimes done to rule out other conditions.  TREATMENT  Acute bronchitis usually goes away in a couple weeks. Oftentimes, no medical treatment is necessary. Medicines are sometimes given for relief of fever or cough. Antibiotic medicines are usually not needed but may be prescribed in certain situations. In some cases, an inhaler may be recommended to help reduce shortness of breath and control the cough. A cool mist vaporizer may also be used to help thin bronchial secretions and make it easier to clear the chest.  HOME CARE INSTRUCTIONS  Get plenty of rest.   Drink enough fluids to keep your urine clear or pale yellow (unless you have a medical condition that requires fluid restriction). Increasing fluids may help thin  your respiratory secretions (sputum) and reduce chest congestion, and it will prevent dehydration.   Take medicines only as directed by your health care provider.  If you were prescribed an antibiotic medicine, finish it all even if you start to feel better.  Avoid smoking and secondhand smoke. Exposure to cigarette smoke or irritating chemicals will make bronchitis worse. If you are a smoker, consider using nicotine gum or skin patches to help control withdrawal symptoms. Quitting smoking will help your lungs heal faster.   Reduce the chances of another bout of acute bronchitis by washing your hands frequently, avoiding people with cold symptoms, and trying not to touch your hands to your mouth, nose, or eyes.   Keep all follow-up visits as directed by your health care provider.  SEEK MEDICAL CARE IF: Your symptoms do not improve after 1 week of treatment.  SEEK IMMEDIATE MEDICAL CARE IF:  You develop an increased fever or chills.   You have chest pain.   You have severe shortness of breath.  You have bloody sputum.   You develop dehydration.  You faint or repeatedly feel like you are going to pass out.  You develop repeated vomiting.  You develop a severe headache. MAKE SURE YOU:   Understand these instructions.  Will watch your condition.  Will get help right away if you are not doing well or get worse.   This information is not intended to replace advice given to you by your health care provider. Make sure you discuss any questions you have with your health care provider.   Document Released: 02/22/2004 Document  Revised: 02/04/2014 Document Reviewed: 07/07/2012 Elsevier Interactive Patient Education [employer].

## 2015-03-08 NOTE — ED Notes (Signed)
Pt here with cough, chest pain, SOB and weakness. sts his CPAP was not working last night.

## 2015-03-08 NOTE — ED Provider Notes (Signed)
CSN: [id number]     Arrival date & time 03/08/15  1035 History   First MD Initiated Contact with Patient 03/08/15 1132     Chief Complaint  Patient presents with  . Chest Pain  . Cough     (Consider location/radiation/quality/duration/timing/severity/associated sxs/prior Treatment) HPI Complains of chest pain with cough productive of yellow sputum onset 3 days ago. No fever Feeling as if he cannot get a deep breath. Denies fever denies other associated symptoms. Treated with his wife's albuterol HFA with partial relief. Home CPAP was not helping him last night. No other associated symptoms. Nothing makes symptoms better or worse. Past Medical History  Diagnosis Date  . Sleep apnea   . Hypercholesteremia   . Cataract   . Arthritis   . Hypertension    Past Surgical History  Procedure Laterality Date  . Hammer toe surgery    . Replacement total knee Right   . Replacement total knee bilateral     History reviewed. No pertinent family history. Social History  Substance Use Topics  . Smoking status: Former Smoker    Quit date: 03/05/1988  . Smokeless tobacco: None  . Alcohol Use: Yes    Review of Systems  Constitutional: Negative.   HENT: Negative.   Respiratory: Positive for cough and shortness of breath.   Cardiovascular: Positive for chest pain.  Gastrointestinal: Negative.   Musculoskeletal: Negative.   Skin: Negative.   Neurological: Negative.   Psychiatric/Behavioral: Negative.   All other systems reviewed and are negative.     Allergies  Review of patient's allergies indicates no known allergies.  Home Medications   Prior to Admission medications   Medication Sig Start Date End Date Taking? Authorizing Provider  FLUoxetine (PROZAC) 40 MG capsule Take 40 mg by mouth daily. 02/19/15   Historical Provider, MD  lisinopril (PRINIVIL,ZESTRIL) 10 MG tablet Take 10 mg by mouth daily. 12/21/14   Historical Provider, MD  lovastatin (MEVACOR) 40 MG tablet Take 80 mg by  mouth every evening. 02/08/15   Historical Provider, MD   BP 133/86 mmHg  Pulse 103  Temp(Src) 97.7 F (36.5 C) (Oral)  Resp 17  SpO2 97% Physical Exam  Constitutional: He appears well-developed and well-nourished.  HENT:  Head: Normocephalic and atraumatic.  Eyes: Conjunctivae are normal. Pupils are equal, round, and reactive to light.  Neck: Neck supple. No tracheal deviation present. No thyromegaly present.  Cardiovascular: Normal rate and regular rhythm.   No murmur heard. Pulmonary/Chest: Effort normal. No respiratory distress. He has wheezes.  Expiratory wheeze  Abdominal: Soft. Bowel sounds are normal. He exhibits no distension. There is no tenderness.  Musculoskeletal: Normal range of motion. He exhibits no edema or tenderness.  Neurological: He is alert. Coordination normal.  Skin: Skin is warm and dry. No rash noted.  Psychiatric: He has a normal mood and affect.  Nursing note and vitals reviewed.   ED Course  Procedures (including critical care time) Labs Review Labs Reviewed  BASIC METABOLIC PANEL - Abnormal; Notable for the following:    Chloride 100 (*)    Glucose, Bld 115 (*)    All other components within normal limits  CBC  BRAIN NATRIURETIC PEPTIDE  I-STAT TROPOININ, ED    Imaging Review Dg Chest 2 View  03/06/2015  CLINICAL DATA:  Shortness of breath with productive cough since yesterday. On CPAP at home. History of hypertension. EXAM: CHEST  2 VIEW COMPARISON:  None. FINDINGS: The heart size is normal. There is a possible calcified right  paratracheal lymph node. The mediastinal contours are otherwise normal. The lungs are clear. There is no pleural effusion or pneumothorax. Mild degenerative changes are present throughout the thoracic spine. IMPRESSION: No active cardiopulmonary process. Electronically Signed   By: Richardean Sale M.D.   On: 03/06/2015 18:21   I have personally reviewed and evaluated these images and lab results as part of my medical  decision-making.   EKG Interpretation   Date/Time:  Wednesday March 08 2015 10:46:37 EST Ventricular Rate:  98 PR Interval:  170 QRS Duration: 74 QT Interval:  338 QTC Calculation: 431 R Axis:   -9 Text Interpretation:  Normal sinus rhythm Normal ECG No significant change  since last tracing Confirmed by Hatem Cull  MD, Rosalena Mccorry [phone]) on 03/08/2015  12:15:57 PM     Chest x-ray viewed by me 1:20 PM breathing is normal after one nebulized albuterol treatment. Lungs clear to auscultation he speaks inparagraphs.   She is in no respiratory distress Results for orders placed or performed during the hospital encounter of [id number]  Basic metabolic panel  Result Value Ref Range   Sodium 141 135 - 145 mmol/L   Potassium 4.6 3.5 - 5.1 mmol/L   Chloride 100 (L) 101 - 111 mmol/L   CO2 29 22 - 32 mmol/L   Glucose, Bld 115 (H) 65 - 99 mg/dL   BUN 13 6 - 20 mg/dL   Creatinine, Ser 0.89 0.61 - 1.24 mg/dL   Calcium 9.4 8.9 - 10.3 mg/dL   GFR calc non Af Amer >60 >60 mL/min   GFR calc Af Amer >60 >60 mL/min   Anion gap 12 5 - 15  CBC  Result Value Ref Range   WBC 10.2 4.0 - 10.5 K/uL   RBC 5.08 4.22 - 5.81 MIL/uL   Hemoglobin 15.6 13.0 - 17.0 g/dL   HCT 46.7 39.0 - 52.0 %   MCV 91.9 78.0 - 100.0 fL   MCH 30.7 26.0 - 34.0 pg   MCHC 33.4 30.0 - 36.0 g/dL   RDW 12.7 11.5 - 15.5 %   Platelets 238 150 - 400 K/uL  Brain natriuretic peptide  Result Value Ref Range   B Natriuretic Peptide 15.0 0.0 - 100.0 pg/mL  I-stat troponin, ED (not at [facility], [facility])  Result Value Ref Range   Troponin i, poc 0.00 0.00 - 0.08 ng/mL   Comment 3           Dg Chest 2 View  03/08/2015  CLINICAL DATA:  Shortness of breath for 4 days, productive cough. Sternal pain. EXAM: CHEST  2 VIEW COMPARISON:  None. FINDINGS: Cardiomediastinal silhouette is normal in size and configuration. Lungs are clear. Lung volumes are normal. No evidence of pneumonia. No pleural effusion. No pneumothorax. Mild degenerative change again  noted within the slightly kyphotic thoracic spine, with stable ankylosis. No acute osseous abnormality. Soft tissues about the chest are unremarkable. IMPRESSION: Lungs are clear and there is no evidence of acute cardiopulmonary abnormality. Electronically Signed   By: Franki Cabot M.D.   On: 03/08/2015 11:47   Dg Chest 2 View  03/06/2015  CLINICAL DATA:  Shortness of breath with productive cough since yesterday. On CPAP at home. History of hypertension. EXAM: CHEST  2 VIEW COMPARISON:  None. FINDINGS: The heart size is normal. There is a possible calcified right paratracheal lymph node. The mediastinal contours are otherwise normal. The lungs are clear. There is no pleural effusion or pneumothorax. Mild degenerative changes are present throughout the thoracic spine. IMPRESSION:  No active cardiopulmonary process. Electronically Signed   By: Richardean Sale M.D.   On: 03/06/2015 18:21   Mr Jeri Cos [id number] Contrast  02/23/2015  CLINICAL DATA:  Cerebellar cyst. Abnormal MRI of the cervical spine. Creatinine was obtained on site at [facility] at [address] Results: Creatinine 0.9 mg/dL. EXAM: MRI HEAD WITHOUT AND WITH CONTRAST TECHNIQUE: Multiplanar, multiecho pulse sequences of the brain and surrounding structures were obtained without and with intravenous contrast. CONTRAST:  68mL MULTIHANCE GADOBENATE DIMEGLUMINE 529 MG/ML IV SOLN COMPARISON:  MRI of the cervical spine 02/08/2015 FINDINGS: A solid and cystic posterior fossa mass lesion is demonstrated. There is an enhancing solid nodule is to the left of midline along the superior cerebellar vermis which measures 13 x 7 x 6 mm. A much larger cystic component with a single septation measures 4.4 x 3.5 x 3.5 cm. This creates mass effect on the cerebellum. The cystic component extends to the tentorium. The fourth ventricle is intact. No additional enhancing lesions or cysts are present. No focal supratentorial lesions are present. Mild periventricular  and subcortical T2 changes are within normal limits for age. The brainstem is unremarkable. Internal auditory canals are within normal limits bilaterally. Flow is present in the major intracranial arteries. Bilateral lens replacements are present. The globes and orbits are otherwise intact. Mild mucosal thickening is present along the floor of the maxillary sinuses bilaterally. Asymmetric left-sided ethmoid sinus disease is present. Circumferential mucosal thickening is present in the left frontal sinus. There is fluid level in the left sphenoid sinus. The mastoid air cells are clear. Skullbase is within normal limits. No other focal midline sagittal abnormalities are present. IMPRESSION: 1. 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 The enhancing nodular component is just to the left of midline along the superior cerebellar vermis. 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 The patient is slightly older than the typical demographics. The differential diagnosis includes a cystic metastasis or less likely a cystic GBM. There is no restricted diffusion to support the diagnosis of GBM. 2. No additional lesions. 3. Although there is mass effect along the superior cerebellum, the fourth ventricle is intact and the foramen magnum is clear. There is no downward herniation. 4. Normal appearance of the supratentorial brain. 5. Sinus disease as described. A fluid level is present in the left sphenoid sinus. Electronically Signed   By: San Morelle M.D.   On: 02/23/2015 07:43    MDM  Patient's pulse oximetry is marginal at 8-90% on discharge. I suspect this is baseline for him. Plan albuterol HFA with spacer to go to use 2 puffs every 4 hours when necessary cough or shortness of breath. Return if they have more than every 4 hours or see PMD Diagnosis acute bronchitis Final diagnoses:  None        Orlie Dakin, MD 03/08/15 1329

## 2015-03-09 ENCOUNTER — Encounter (HOSPITAL_COMMUNITY): Payer: Self-pay | Admitting: Emergency Medicine

## 2015-03-09 NOTE — Progress Notes (Addendum)
Anesthesia Chart Review:  Pt is a 70 year old male scheduled for suboccipital craniectomy for tumor resection on 03/15/2015 with Dr. Cyndy Freeze.   PCP is Dr. Ramonita Lab (care everywhere). Cardiologist is Dr. Lujean Amel (care everywhere), last office visit 12/23/13.   PMH includes:  HTN, hyperlipidemia, [name] (on CPAP). Former smoker. BMI 40.   Pt seen in ER 03/08/15 for cough, yellow sputum. CBC, CXR normal. Left voicemail for Janett Billow in Dr. Hewitt Shorts office about pt's probable viral infection.   Medications include: albuterol, lisinopril, lovastatin.   Preoperative labs reviewed.    Chest x-ray 2/8/17reviewed. Lungs are clear and there is no evidence of acute cardiopulmonary abnormality.  EKG 03/08/15: NSR.   Nuclear stress test 11/30/12:  1. Normal treadmill ECG without evidence of ischemia or dysrhythmia 2. Average exercise tolerance for age 30. Mild global LV dysfunction, EF 46% 4. Normal myocardial perfusion images at rest and peak stress without evidence of ischemia.   By notes, pt had an echo 09/2011 with normal LVEF, moderate concentric LVH, moderate MR with mild LAE, normal RV systolic function and pressures, worsening of the mitral valve disease from 2010 study.   Reviewed case with Dr. Kalman Shan.   If no changes, I anticipate pt can proceed with surgery as scheduled.   Willeen Cass, FNP-BC [facility]/Anesthesiology Phone: [phone] 03/10/2015 12:41 PM  Addendum: Janett Billow from Dr. Hewitt Shorts office called. She has follow-up with patient. He says he is feeling much better from his URI. Only reported need for albuterol for two days. Levada Dy already reviewed chart with an anesthesiologist. Patient to be further evaluated on the day of surgery to ensure no concerning cardiopulmonary signs/symptoms prior to proceeding.  George Hugh [facility]/Anesthesiology Phone [phone] 03/13/2015 12:31 PM

## 2015-03-14 MED ORDER — DEXTROSE 5 % IV SOLN
3.0000 g | INTRAVENOUS | Status: AC
  Administered 2015-03-15: 3 g via INTRAVENOUS
  Filled 2015-03-14: qty 3000

## 2015-03-15 ENCOUNTER — Encounter (HOSPITAL_COMMUNITY): Payer: Self-pay | Admitting: *Deleted

## 2015-03-15 ENCOUNTER — Inpatient Hospital Stay (HOSPITAL_COMMUNITY): Payer: Medicare Other | Admitting: Vascular Surgery

## 2015-03-15 ENCOUNTER — Inpatient Hospital Stay (HOSPITAL_COMMUNITY)
Admission: RE | Admit: 2015-03-15 | Discharge: 2015-03-17 | DRG: 026 | Disposition: A | Discharge status: Rehabilitation Facility | Payer: Medicare Other | Source: Ambulatory Visit | Attending: Neurological Surgery | Admitting: Neurological Surgery

## 2015-03-15 ENCOUNTER — Encounter (HOSPITAL_COMMUNITY)
Admission: RE | Disposition: A | Discharge status: Rehabilitation Facility | Payer: Self-pay | Source: Ambulatory Visit | Attending: Neurological Surgery

## 2015-03-15 ENCOUNTER — Inpatient Hospital Stay (HOSPITAL_COMMUNITY): Payer: Medicare Other | Admitting: Emergency Medicine

## 2015-03-15 DIAGNOSIS — F329 Major depressive disorder, single episode, unspecified: Secondary | ICD-10-CM | POA: Diagnosis present

## 2015-03-15 DIAGNOSIS — E7800 Pure hypercholesterolemia, unspecified: Secondary | ICD-10-CM | POA: Diagnosis present

## 2015-03-15 DIAGNOSIS — R270 Ataxia, unspecified: Secondary | ICD-10-CM | POA: Diagnosis not present

## 2015-03-15 DIAGNOSIS — G119 Hereditary ataxia, unspecified: Secondary | ICD-10-CM | POA: Diagnosis present

## 2015-03-15 DIAGNOSIS — G4730 Sleep apnea, unspecified: Secondary | ICD-10-CM | POA: Diagnosis present

## 2015-03-15 DIAGNOSIS — I10 Essential (primary) hypertension: Secondary | ICD-10-CM | POA: Diagnosis present

## 2015-03-15 DIAGNOSIS — N401 Enlarged prostate with lower urinary tract symptoms: Secondary | ICD-10-CM | POA: Diagnosis not present

## 2015-03-15 DIAGNOSIS — E785 Hyperlipidemia, unspecified: Secondary | ICD-10-CM | POA: Diagnosis present

## 2015-03-15 DIAGNOSIS — D72829 Elevated white blood cell count, unspecified: Secondary | ICD-10-CM | POA: Diagnosis not present

## 2015-03-15 DIAGNOSIS — R338 Other retention of urine: Secondary | ICD-10-CM | POA: Diagnosis not present

## 2015-03-15 DIAGNOSIS — G441 Vascular headache, not elsewhere classified: Secondary | ICD-10-CM | POA: Diagnosis not present

## 2015-03-15 DIAGNOSIS — Z96653 Presence of artificial knee joint, bilateral: Secondary | ICD-10-CM | POA: Diagnosis present

## 2015-03-15 DIAGNOSIS — D496 Neoplasm of unspecified behavior of brain: Secondary | ICD-10-CM | POA: Diagnosis present

## 2015-03-15 DIAGNOSIS — G9389 Other specified disorders of brain: Secondary | ICD-10-CM

## 2015-03-15 DIAGNOSIS — R269 Unspecified abnormalities of gait and mobility: Secondary | ICD-10-CM | POA: Diagnosis present

## 2015-03-15 DIAGNOSIS — G939 Disorder of brain, unspecified: Secondary | ICD-10-CM | POA: Diagnosis present

## 2015-03-15 DIAGNOSIS — Z87891 Personal history of nicotine dependence: Secondary | ICD-10-CM

## 2015-03-15 DIAGNOSIS — Z79899 Other long term (current) drug therapy: Secondary | ICD-10-CM

## 2015-03-15 DIAGNOSIS — G4459 Other complicated headache syndrome: Secondary | ICD-10-CM | POA: Diagnosis not present

## 2015-03-15 HISTORY — PX: CRANIOTOMY: SHX93

## 2015-03-15 HISTORY — PX: APPLICATION OF CRANIAL NAVIGATION: SHX6578

## 2015-03-15 LAB — MRSA PCR SCREENING: MRSA BY PCR: NEGATIVE

## 2015-03-15 SURGERY — CRANIOTOMY TUMOR EXCISION
Anesthesia: General | Site: Head

## 2015-03-15 MED ORDER — ROCURONIUM BROMIDE 50 MG/5ML IV SOLN
INTRAVENOUS | Status: AC
  Filled 2015-03-15: qty 1

## 2015-03-15 MED ORDER — ROCURONIUM BROMIDE 100 MG/10ML IV SOLN
INTRAVENOUS | Status: DC | PRN
  Administered 2015-03-15: 20 mg via INTRAVENOUS
  Administered 2015-03-15 (×2): 50 mg via INTRAVENOUS
  Administered 2015-03-15 (×2): 10 mg via INTRAVENOUS
  Administered 2015-03-15: 20 mg via INTRAVENOUS

## 2015-03-15 MED ORDER — HYDROCODONE-ACETAMINOPHEN 5-325 MG PO TABS
1.0000 | ORAL_TABLET | ORAL | Status: DC | PRN
Start: 2015-03-15 — End: 2015-03-17
  Administered 2015-03-16 – 2015-03-17 (×6): 1 via ORAL
  Filled 2015-03-15 (×6): qty 1

## 2015-03-15 MED ORDER — SODIUM CHLORIDE 0.9 % IJ SOLN
INTRAMUSCULAR | Status: AC
  Filled 2015-03-15: qty 10

## 2015-03-15 MED ORDER — HYDROMORPHONE HCL 1 MG/ML IJ SOLN
INTRAMUSCULAR | Status: AC
  Filled 2015-03-15: qty 1

## 2015-03-15 MED ORDER — ONDANSETRON HCL 4 MG PO TABS
4.0000 mg | ORAL_TABLET | ORAL | Status: DC | PRN
Start: 2015-03-15 — End: 2015-03-17

## 2015-03-15 MED ORDER — LIDOCAINE HCL (CARDIAC) 20 MG/ML IV SOLN
INTRAVENOUS | Status: DC | PRN
  Administered 2015-03-15: 100 mg via INTRAVENOUS

## 2015-03-15 MED ORDER — EPHEDRINE SULFATE 50 MG/ML IJ SOLN
INTRAMUSCULAR | Status: AC
  Filled 2015-03-15: qty 1

## 2015-03-15 MED ORDER — ROCURONIUM BROMIDE 50 MG/5ML IV SOLN
INTRAVENOUS | Status: AC
  Filled 2015-03-15: qty 2

## 2015-03-15 MED ORDER — MANNITOL 25 % IV SOLN
INTRAVENOUS | Status: DC | PRN
  Administered 2015-03-15: 50 g via INTRAVENOUS

## 2015-03-15 MED ORDER — PROPOFOL 10 MG/ML IV BOLUS
INTRAVENOUS | Status: DC | PRN
  Administered 2015-03-15: 150 mg via INTRAVENOUS
  Administered 2015-03-15 (×2): 50 mg via INTRAVENOUS

## 2015-03-15 MED ORDER — SODIUM CHLORIDE 0.9 % IV SOLN
INTRAVENOUS | Status: DC | PRN
  Administered 2015-03-15 (×3): via INTRAVENOUS

## 2015-03-15 MED ORDER — SENNA 8.6 MG PO TABS
1.0000 | ORAL_TABLET | Freq: Two times a day (BID) | ORAL | Status: DC
  Administered 2015-03-16 – 2015-03-17 (×3): 8.6 mg via ORAL
  Filled 2015-03-15 (×3): qty 1

## 2015-03-15 MED ORDER — MIDAZOLAM HCL 2 MG/2ML IJ SOLN
INTRAMUSCULAR | Status: AC
  Filled 2015-03-15: qty 2

## 2015-03-15 MED ORDER — THROMBIN 5000 UNITS EX SOLR
CUTANEOUS | Status: DC | PRN
  Administered 2015-03-15: 10 mL via TOPICAL

## 2015-03-15 MED ORDER — LIDOCAINE-EPINEPHRINE 2 %-1:100000 IJ SOLN
INTRAMUSCULAR | Status: DC | PRN
  Administered 2015-03-15: 15 mL via INTRADERMAL

## 2015-03-15 MED ORDER — SUGAMMADEX SODIUM 200 MG/2ML IV SOLN
INTRAVENOUS | Status: AC
  Filled 2015-03-15: qty 2

## 2015-03-15 MED ORDER — NALOXONE HCL 0.4 MG/ML IJ SOLN: 0.0800 mg | INTRAMUSCULAR | Status: DC | PRN

## 2015-03-15 MED ORDER — MEPERIDINE HCL 25 MG/ML IJ SOLN: 6.2500 mg | INTRAMUSCULAR | Status: DC | PRN

## 2015-03-15 MED ORDER — SODIUM CHLORIDE 0.9 % IR SOLN
Status: DC | PRN
  Administered 2015-03-15: 500 mL

## 2015-03-15 MED ORDER — THROMBIN 20000 UNITS EX SOLR
CUTANEOUS | Status: DC | PRN
  Administered 2015-03-15: 20 mL via TOPICAL

## 2015-03-15 MED ORDER — LISINOPRIL 10 MG PO TABS
10.0000 mg | ORAL_TABLET | Freq: Every day | ORAL | Status: DC
  Administered 2015-03-17: 10 mg via ORAL
  Filled 2015-03-15: qty 1

## 2015-03-15 MED ORDER — PHENYLEPHRINE 40 MCG/ML (10ML) SYRINGE FOR IV PUSH (FOR BLOOD PRESSURE SUPPORT)
PREFILLED_SYRINGE | INTRAVENOUS | Status: AC
  Filled 2015-03-15: qty 10

## 2015-03-15 MED ORDER — FENTANYL CITRATE (PF) 250 MCG/5ML IJ SOLN
INTRAMUSCULAR | Status: AC
  Filled 2015-03-15: qty 5

## 2015-03-15 MED ORDER — HYDROMORPHONE HCL 1 MG/ML IJ SOLN
0.2500 mg | INTRAMUSCULAR | Status: DC | PRN
  Administered 2015-03-15 (×4): 0.5 mg via INTRAVENOUS

## 2015-03-15 MED ORDER — ONDANSETRON HCL 4 MG/2ML IJ SOLN
4.0000 mg | INTRAMUSCULAR | Status: DC | PRN
  Administered 2015-03-16: 4 mg via INTRAVENOUS
  Filled 2015-03-15: qty 2

## 2015-03-15 MED ORDER — PROMETHAZINE HCL 25 MG PO TABS: 12.5000 mg | ORAL_TABLET | ORAL | Status: DC | PRN

## 2015-03-15 MED ORDER — ONDANSETRON HCL 4 MG/2ML IJ SOLN
INTRAMUSCULAR | Status: AC
  Filled 2015-03-15: qty 2

## 2015-03-15 MED ORDER — LABETALOL HCL 5 MG/ML IV SOLN
INTRAVENOUS | Status: DC | PRN
  Administered 2015-03-15: 10 mg via INTRAVENOUS

## 2015-03-15 MED ORDER — SODIUM CHLORIDE 0.9 % IV SOLN
INTRAVENOUS | Status: DC
  Administered 2015-03-15 – 2015-03-17 (×2): via INTRAVENOUS

## 2015-03-15 MED ORDER — DOCUSATE SODIUM 100 MG PO CAPS
100.0000 mg | ORAL_CAPSULE | Freq: Two times a day (BID) | ORAL | Status: DC
  Administered 2015-03-16 – 2015-03-17 (×3): 100 mg via ORAL
  Filled 2015-03-15 (×3): qty 1

## 2015-03-15 MED ORDER — CEFAZOLIN SODIUM-DEXTROSE 2-3 GM-% IV SOLR
2.0000 g | Freq: Three times a day (TID) | INTRAVENOUS | Status: AC
  Administered 2015-03-15 – 2015-03-16 (×2): 2 g via INTRAVENOUS
  Filled 2015-03-15 (×2): qty 50

## 2015-03-15 MED ORDER — SUGAMMADEX SODIUM 200 MG/2ML IV SOLN
INTRAVENOUS | Status: DC | PRN
  Administered 2015-03-15: 200 mg via INTRAVENOUS

## 2015-03-15 MED ORDER — MIDAZOLAM HCL 5 MG/5ML IJ SOLN
INTRAMUSCULAR | Status: DC | PRN
  Administered 2015-03-15 (×2): 1 mg via INTRAVENOUS

## 2015-03-15 MED ORDER — PROMETHAZINE HCL 25 MG/ML IJ SOLN
6.2500 mg | Freq: Once | INTRAMUSCULAR | Status: AC
  Administered 2015-03-15: 6.25 mg via INTRAVENOUS

## 2015-03-15 MED ORDER — DEXAMETHASONE SODIUM PHOSPHATE 10 MG/ML IJ SOLN
INTRAMUSCULAR | Status: DC | PRN
  Administered 2015-03-15: 10 mg via INTRAVENOUS

## 2015-03-15 MED ORDER — CEFAZOLIN SODIUM 1-5 GM-% IV SOLN
INTRAVENOUS | Status: AC
Start: 2015-03-15 — End: 2015-03-15
  Filled 2015-03-15: qty 50

## 2015-03-15 MED ORDER — ALBUTEROL SULFATE (2.5 MG/3ML) 0.083% IN NEBU: 3.0000 mL | INHALATION_SOLUTION | RESPIRATORY_TRACT | Status: DC | PRN

## 2015-03-15 MED ORDER — PROPOFOL 10 MG/ML IV BOLUS
INTRAVENOUS | Status: AC
  Filled 2015-03-15: qty 20

## 2015-03-15 MED ORDER — HYDRALAZINE HCL 20 MG/ML IJ SOLN
INTRAMUSCULAR | Status: AC
  Filled 2015-03-15: qty 1

## 2015-03-15 MED ORDER — ONDANSETRON HCL 4 MG/2ML IJ SOLN
INTRAMUSCULAR | Status: DC | PRN
  Administered 2015-03-15: 4 mg via INTRAVENOUS

## 2015-03-15 MED ORDER — 0.9 % SODIUM CHLORIDE (POUR BTL) OPTIME
TOPICAL | Status: DC | PRN
  Administered 2015-03-15 (×3): 1000 mL

## 2015-03-15 MED ORDER — PANTOPRAZOLE SODIUM 40 MG IV SOLR
40.0000 mg | Freq: Every day | INTRAVENOUS | Status: DC
  Administered 2015-03-15 – 2015-03-16 (×2): 40 mg via INTRAVENOUS
  Filled 2015-03-15 (×2): qty 40

## 2015-03-15 MED ORDER — LIDOCAINE HCL (CARDIAC) 20 MG/ML IV SOLN
INTRAVENOUS | Status: AC
  Filled 2015-03-15: qty 5

## 2015-03-15 MED ORDER — FENTANYL CITRATE (PF) 100 MCG/2ML IJ SOLN
INTRAMUSCULAR | Status: DC | PRN
  Administered 2015-03-15: 250 ug via INTRAVENOUS

## 2015-03-15 MED ORDER — NICARDIPINE HCL IN NACL 20-0.86 MG/200ML-% IV SOLN
3.0000 mg/h | INTRAVENOUS | Status: DC
  Filled 2015-03-15: qty 200

## 2015-03-15 MED ORDER — ONDANSETRON HCL 4 MG/2ML IJ SOLN
4.0000 mg | Freq: Once | INTRAMUSCULAR | Status: AC | PRN
  Administered 2015-03-15: 4 mg via INTRAVENOUS

## 2015-03-15 MED ORDER — FLEET ENEMA 7-19 GM/118ML RE ENEM: 1.0000 | ENEMA | Freq: Once | RECTAL | Status: DC | PRN

## 2015-03-15 MED ORDER — PRAVASTATIN SODIUM 20 MG PO TABS
10.0000 mg | ORAL_TABLET | Freq: Every day | ORAL | Status: DC
  Administered 2015-03-16 – 2015-03-17 (×2): 10 mg via ORAL
  Filled 2015-03-15 (×2): qty 1

## 2015-03-15 MED ORDER — BISACODYL 5 MG PO TBEC: 5.0000 mg | DELAYED_RELEASE_TABLET | Freq: Every day | ORAL | Status: DC | PRN

## 2015-03-15 MED ORDER — FLUOXETINE HCL 20 MG PO CAPS
40.0000 mg | ORAL_CAPSULE | Freq: Every day | ORAL | Status: DC
  Administered 2015-03-16 – 2015-03-17 (×2): 40 mg via ORAL
  Filled 2015-03-15 (×2): qty 2

## 2015-03-15 MED ORDER — PHENYLEPHRINE HCL 10 MG/ML IJ SOLN
10.0000 mg | INTRAVENOUS | Status: DC | PRN
  Administered 2015-03-15: 40 ug/min via INTRAVENOUS

## 2015-03-15 MED ORDER — HYDROMORPHONE HCL 1 MG/ML IJ SOLN
0.5000 mg | INTRAMUSCULAR | Status: DC | PRN
  Administered 2015-03-15 – 2015-03-17 (×11): 1 mg via INTRAVENOUS
  Filled 2015-03-15 (×11): qty 1

## 2015-03-15 MED ORDER — PROMETHAZINE HCL 25 MG/ML IJ SOLN
INTRAMUSCULAR | Status: AC
  Administered 2015-03-15: 6.25 mg
  Filled 2015-03-15: qty 1

## 2015-03-15 MED ORDER — FUROSEMIDE 10 MG/ML IJ SOLN
INTRAMUSCULAR | Status: DC | PRN
  Administered 2015-03-15: 10 mg via INTRAMUSCULAR

## 2015-03-15 MED ORDER — HYDRALAZINE HCL 20 MG/ML IJ SOLN
5.0000 mg | INTRAMUSCULAR | Status: DC | PRN
  Administered 2015-03-15 (×2): 10 mg via INTRAVENOUS
  Filled 2015-03-15: qty 1

## 2015-03-15 MED ORDER — BUPIVACAINE-EPINEPHRINE (PF) 0.5% -1:200000 IJ SOLN
INTRAMUSCULAR | Status: DC | PRN
  Administered 2015-03-15: 15 mL via PERINEURAL

## 2015-03-15 MED ORDER — SODIUM CHLORIDE 0.9 % IV SOLN
0.0500 ug/kg/min | INTRAVENOUS | Status: DC
  Administered 2015-03-15: 0.1 ug/kg/min via INTRAVENOUS
  Filled 2015-03-15: qty 5000

## 2015-03-15 MED ORDER — MICROFIBRILLAR COLL HEMOSTAT EX PADS
MEDICATED_PAD | CUTANEOUS | Status: DC | PRN
Start: 2015-03-15 — End: 2015-03-15
  Administered 2015-03-15: 1 via TOPICAL

## 2015-03-15 SURGICAL SUPPLY — 88 items
APL SKNCLS STERI-STRIP NONHPOA (GAUZE/BANDAGES/DRESSINGS)
APL SRG 60D 8 XTD TIP BNDBL (TIP) ×2
APPLICATOR CHLORAPREP 3ML ORNG (MISCELLANEOUS) ×4 IMPLANT
BENZOIN TINCTURE PRP APPL 2/3 (GAUZE/BANDAGES/DRESSINGS) IMPLANT
BLADE CLIPPER SURG (BLADE) ×4 IMPLANT
BLADE ULTRA TIP 2M (BLADE) ×4 IMPLANT
BNDG GAUZE ELAST 4 BULKY (GAUZE/BANDAGES/DRESSINGS) IMPLANT
BRUSH SCRUB EZ 1% IODOPHOR (MISCELLANEOUS) ×4 IMPLANT
BUR ACORN 6.0 PRECISION (BURR) ×3 IMPLANT
BUR ACORN 6.0MM PRECISION (BURR) ×1
BUR ADDG 1.1 (BURR) IMPLANT
BUR ADDG 1.1MM (BURR)
BUR MATCHSTICK NEURO 3.0 LAGG (BURR) ×2 IMPLANT
BUR RND DIAMOND ELITE 4.0 (BURR) ×1 IMPLANT
BUR RND DIAMOND ELITE 4.0MM (BURR) ×1
BUR ROUND FLUTED 5 RND (BURR) ×1 IMPLANT
BUR ROUND FLUTED 5MM RND (BURR) ×1
BUR SPIRAL ROUTER 2.3 (BUR) IMPLANT
BUR SPIRAL ROUTER 2.3MM (BUR)
CANISTER SUCT 3000ML PPV (MISCELLANEOUS) ×4 IMPLANT
CATH ROBINSON RED A/P 14FR (CATHETERS) IMPLANT
CLIP TI MEDIUM 6 (CLIP) IMPLANT
DRAIN SNY WOU 7FLT (WOUND CARE) IMPLANT
DRAPE NEUROLOGICAL W/INCISE (DRAPES) ×4 IMPLANT
DRAPE SHEET LG 3/4 BI-LAMINATE (DRAPES) ×8 IMPLANT
DRAPE SURG 17X23 STRL (DRAPES) IMPLANT
DRAPE WARM FLUID 44X44 (DRAPE) ×4 IMPLANT
DRSG MEPILEX BORDER 4X12 (GAUZE/BANDAGES/DRESSINGS) ×2 IMPLANT
DURAMATRIX ONLAY 3X3 (Plate) ×2 IMPLANT
DURASEAL APPLICATOR TIP (TIP) ×2 IMPLANT
DURASEAL SPINE SEALANT 3ML (MISCELLANEOUS) ×2 IMPLANT
ELECT REM PT RETURN 9FT ADLT (ELECTROSURGICAL) ×4
ELECTRODE REM PT RTRN 9FT ADLT (ELECTROSURGICAL) ×2 IMPLANT
EVACUATOR 1/8 PVC DRAIN (DRAIN) IMPLANT
EVACUATOR SILICONE 100CC (DRAIN) IMPLANT
GAUZE SPONGE 4X4 12PLY STRL (GAUZE/BANDAGES/DRESSINGS) ×4 IMPLANT
GAUZE SPONGE 4X4 16PLY XRAY LF (GAUZE/BANDAGES/DRESSINGS) IMPLANT
GLOVE BIOGEL PI IND STRL 7.5 (GLOVE) ×2 IMPLANT
GLOVE BIOGEL PI INDICATOR 7.5 (GLOVE) ×2
GLOVE EXAM NITRILE LRG STRL (GLOVE) ×4 IMPLANT
GLOVE EXAM NITRILE MD LF STRL (GLOVE) IMPLANT
GLOVE EXAM NITRILE XL STR (GLOVE) IMPLANT
GLOVE EXAM NITRILE XS STR PU (GLOVE) IMPLANT
GLOVE SS BIOGEL STRL SZ 7 (GLOVE) ×4 IMPLANT
GLOVE SUPERSENSE BIOGEL SZ 7 (GLOVE) ×4
GOWN STRL REUS W/ TWL LRG LVL3 (GOWN DISPOSABLE) ×2 IMPLANT
GOWN STRL REUS W/ TWL XL LVL3 (GOWN DISPOSABLE) IMPLANT
GOWN STRL REUS W/TWL 2XL LVL3 (GOWN DISPOSABLE) IMPLANT
GOWN STRL REUS W/TWL LRG LVL3 (GOWN DISPOSABLE) ×4
GOWN STRL REUS W/TWL XL LVL3 (GOWN DISPOSABLE)
HEMOSTAT POWDER KIT SURGIFOAM (HEMOSTASIS) ×4 IMPLANT
HEMOSTAT SURGICEL 2X14 (HEMOSTASIS) IMPLANT
KIT BASIN OR (CUSTOM PROCEDURE TRAY) ×4 IMPLANT
KIT ROOM TURNOVER OR (KITS) ×4 IMPLANT
NDL HYPO 21X1.5 SAFETY (NEEDLE) ×2 IMPLANT
NDL HYPO 25X1 1.5 SAFETY (NEEDLE) ×2 IMPLANT
NEEDLE HYPO 21X1.5 SAFETY (NEEDLE) ×4 IMPLANT
NEEDLE HYPO 25X1 1.5 SAFETY (NEEDLE) ×4 IMPLANT
NS IRRIG 1000ML POUR BTL (IV SOLUTION) ×8 IMPLANT
PACK CRANIOTOMY (CUSTOM PROCEDURE TRAY) ×4 IMPLANT
PATTIES SURGICAL .5 X.5 (GAUZE/BANDAGES/DRESSINGS) ×2 IMPLANT
PATTIES SURGICAL .5 X3 (DISPOSABLE) IMPLANT
PATTIES SURGICAL 1X1 (DISPOSABLE) IMPLANT
PIN MAYFIELD SKULL DISP (PIN) ×4 IMPLANT
SPONGE NEURO XRAY DETECT 1X3 (DISPOSABLE) IMPLANT
SPONGE SURGIFOAM ABS GEL 100 (HEMOSTASIS) ×4 IMPLANT
SPONGE SURGIFOAM ABS GEL 100C (HEMOSTASIS) ×4 IMPLANT
STAPLER VISISTAT 35W (STAPLE) ×4 IMPLANT
STOCKINETTE 6  STRL (DRAPES) ×2
STOCKINETTE 6 STRL (DRAPES) ×2 IMPLANT
SUT ETHILON 2 0 FS 18 (SUTURE) ×2 IMPLANT
SUT ETHILON 3 0 FSL (SUTURE) IMPLANT
SUT ETHILON 3 0 PS 1 (SUTURE) IMPLANT
SUT NURALON 4 0 TR CR/8 (SUTURE) ×8 IMPLANT
SUT VIC AB 0 CT1 18XCR BRD8 (SUTURE) ×4 IMPLANT
SUT VIC AB 0 CT1 8-18 (SUTURE) ×8
SUT VIC AB 2-0 CT1 18 (SUTURE) ×8 IMPLANT
SUT VIC AB 3-0 SH 8-18 (SUTURE) ×8 IMPLANT
SYR 30ML LL (SYRINGE) ×8 IMPLANT
TIP NONSTICK .5MMX23CM (INSTRUMENTS) ×4
TIP NONSTICK .5X23 (INSTRUMENTS) IMPLANT
TOWEL OR 17X24 6PK STRL BLUE (TOWEL DISPOSABLE) ×4 IMPLANT
TOWEL OR 17X26 10 PK STRL BLUE (TOWEL DISPOSABLE) ×4 IMPLANT
TRAY FOLEY W/METER SILVER 14FR (SET/KITS/TRAYS/PACK) ×4 IMPLANT
TUBE CONNECTING 12'X1/4 (SUCTIONS) ×1
TUBE CONNECTING 12X1/4 (SUCTIONS) ×3 IMPLANT
UNDERPAD 30X30 INCONTINENT (UNDERPADS AND DIAPERS) ×4 IMPLANT
WATER STERILE IRR 1000ML POUR (IV SOLUTION) ×4 IMPLANT

## 2015-03-15 NOTE — Progress Notes (Deleted)
03/15/2015  8:04 PM  PATIENT:  Maxwell Carroll  70 y.o. male  PRE-OPERATIVE DIAGNOSIS:  Left superior cerebellar cystic mass  POST-OPERATIVE DIAGNOSIS:  Same  PROCEDURE:  Suboccipital craniectomy for tumor resection, use of stereotactic navigation  SURGEON:  Aldean Ast, MD  ASSISTANTS: Kary Kos, MD  ANESTHESIA:   General  DRAINS: None  SPECIMEN:  Cerebellar mass  INDICATION FOR PROCEDURE: 70 year old man with incidentally discovered cerebellar cystic mass.  I recommended resection. Patient understood the risks, benefits, and alternatives and potential outcomes and wished to proceed.  PROCEDURE DETAILS: After smooth induction of general endotracheal anesthesia the patient was placed in [place] pins and turned prone onto chest rolls. The head was secured to the operating table. Intraoperative stereotactic navigation was then registered and it's accuracy confirmed. The skin was wiped down with alcohol. The planned incision was determined using stereotactic navigation. The area of the planned incision was anesthetized with lidocaine and Marcaine with epinephrine. The patient was then prepped and draped in the usual sterile fashion.  A linear incision was made along the midline in the suboccipital area and extended above the inion. Subperiosteal dissection was performed to expose the suboccipital area. Using a high-speed bur and Kerrison rongeurs a sizable craniectomy was made which approached the torcula and transverse sinuses bilaterally.  I sharply open the dura with a Y-shaped incision and reflected the superior flap towards the torcula. I resected a small amount of left cerebellar hemisphere cortex adjacent to the Vermis and dissected deep to the surface. Encountered the cyst which was opened with bipolar and suction.  Using stereotactic navigation I was able to identify the tumor nodule. Using microsurgical technique and bipolar cautery and pituitary punches I resected the  tumor. Initial specimens were sent for frozen section analysis which were consistent with lesional tissue.   Hemostasis was obtained.  I irrigated with bacitracin saline. I closed the inferior limb of the dural incision primarily. The superior limbs could not be closed. I placed an inlay and onlay of DuraMatrix. I then sealed this with DuraSeal. I irrigated again with bacitracin saline. The wound was then closed in routine anatomic layers with interrupted Vicryl sutures. The skin was closed with a running locked nylon suture. A sterile occlusive dressing was placed. The patient was returned prone to the stretcher in the [place] pins were removed. He awoke uneventfully.   PATIENT DISPOSITION:  PACU then ICU   Delay start of Pharmacological VTE agent (>24hrs) due to surgical blood loss or risk of bleeding:  Yes

## 2015-03-15 NOTE — Progress Notes (Signed)
Awake and alert Moving all extremities Follows commands x4 Cardene gtt to keep SBP < 150

## 2015-03-15 NOTE — Brief Op Note (Signed)
03/15/2015  1:40 PM  PATIENT:  Maxwell Carroll  70 y.o. male  PRE-OPERATIVE DIAGNOSIS:  Neoplasm of cerebellum  POST-OPERATIVE DIAGNOSIS:  Neoplasm of cerebellum  PROCEDURE:  Procedure(s) with comments: Suboccipital craniectomy for tumor resection (N/A) - Suboccipital craniectomy for tumor resection APPLICATION OF CRANIAL NAVIGATION - APPLICATION OF CRANIAL NAVIGATION  SURGEON:  Surgeon(s) and Role:    * Tamala Fothergill, MD - Primary    * Kary Kos, MD - Assisting  PHYSICIAN ASSISTANT:   ASSISTANTS: Kary Kos, MD  ANESTHESIA:   GETA  EBL:  Total I/O In: 2500 [I.V.:2500] Out: 1600 [Urine:1200; Blood:400]  BLOOD ADMINISTERED: None  DRAINS: None  LOCAL MEDICATIONS USED:  Lidocaine and marcaine with epi  SPECIMEN: cerebellar mass  DISPOSITION OF SPECIMEN:  pathology  COUNTS: Correct  TOURNIQUET:  * No tourniquets in log *  DICTATION: .Dragon  PLAN OF CARE: Admit to inpatient   PATIENT DISPOSITION:  PACU - hemodynamically stable.   Delay start of Pharmacological VTE agent (>24hrs) due to surgical blood loss or risk of bleeding: yes

## 2015-03-15 NOTE — Anesthesia Postprocedure Evaluation (Signed)
Anesthesia Post Note  Patient: Maxwell Carroll  Procedure(s) Performed: Procedure(s) (LRB): Suboccipital craniectomy for tumor resection (N/A) [place]  Patient location during evaluation: PACU Anesthesia Type: General Level of consciousness: awake and alert Pain management: pain level controlled Vital Signs Assessment: post-procedure vital signs reviewed and stable Respiratory status: spontaneous breathing, nonlabored ventilation, respiratory function stable and patient connected to nasal cannula oxygen Cardiovascular status: blood pressure returned to baseline and stable Postop Assessment: no signs of nausea or vomiting Anesthetic complications: no    Last Vitals:  Filed Vitals:   03/15/15 1338 03/15/15 1400  BP: 154/71   Pulse: 72 70  Temp:    Resp: 21 19    Last Pain:  Filed Vitals:   03/15/15 1403  PainSc: 10-Worst pain ever                 Milind Raether DAVID

## 2015-03-15 NOTE — Anesthesia Preprocedure Evaluation (Signed)
Anesthesia Evaluation  Patient identified by MRN, date of birth, ID band Patient awake    Reviewed: Allergy & Precautions, NPO status , Patient's Chart, lab work & pertinent test results  Airway Mallampati: I  TM Distance: >3 FB Neck ROM: Full    Dental   Pulmonary sleep apnea , former smoker,    Pulmonary exam normal        Cardiovascular hypertension, Pt. on medications Normal cardiovascular exam     Neuro/Psych Depression    GI/Hepatic   Endo/Other    Renal/GU      Musculoskeletal   Abdominal   Peds  Hematology   Anesthesia Other Findings   Reproductive/Obstetrics                             Anesthesia Physical Anesthesia Plan  ASA: II  Anesthesia Plan: General   Post-op Pain Management:    Induction: Intravenous  Airway Management Planned: Oral ETT  Additional Equipment: Arterial line  Intra-op Plan:   Post-operative Plan: Extubation in OR  Informed Consent: I have reviewed the patients History and Physical, chart, labs and discussed the procedure including the risks, benefits and alternatives for the proposed anesthesia with the patient or authorized representative who has indicated his/her understanding and acceptance.     Plan Discussed with: CRNA and Surgeon  Anesthesia Plan Comments:         Anesthesia Quick Evaluation

## 2015-03-15 NOTE — Progress Notes (Signed)
Gauze dressings applied to both head pin sites

## 2015-03-15 NOTE — H&P (Signed)
CC:  No chief complaint on file.   HPI: Maxwell Carroll is a 70 y.o. male with incidentally discovered posterior fossa cystic mass.  He presents for resection of this mass.  PMH: Past Medical History  Diagnosis Date  . Hypercholesteremia   . Cataract   . Arthritis   . Hypertension   . Sleep apnea   . History of bronchitis   . Numbness and tingling in hands   . Herniated disc, cervical   . Lumbar herniated disc   . History of pneumonia   . Depression   . Seasonal allergies   . [name] (abdominal aortic aneurysm) (HCC)     hx of, but no aneurysm on CT 12/01/14    PSH: Past Surgical History  Procedure Laterality Date  . Hammer toe surgery    . Replacement total knee Right   . Replacement total knee bilateral    . Eye surgery      cataracts  . Colonoscopy    . Esophagogastroduodenoscopy      SH: Social History  Substance Use Topics  . Smoking status: Former Smoker    Quit date: 03/05/1988  . Smokeless tobacco: Never Used  . Alcohol Use: Yes     Comment: occasionally    MEDS: Prior to Admission medications   Medication Sig Start Date End Date Taking? Authorizing Provider  albuterol (PROVENTIL HFA;VENTOLIN HFA) 108 (90 Base) MCG/ACT inhaler Inhale 2 puffs into the lungs every 4 (four) hours as needed for wheezing or shortness of breath.   Yes Historical Provider, MD  FLUoxetine (PROZAC) 40 MG capsule Take 40 mg by mouth daily. 02/19/15  Yes Historical Provider, MD  lisinopril (PRINIVIL,ZESTRIL) 10 MG tablet Take 10 mg by mouth daily. 12/21/14  Yes Historical Provider, MD  lovastatin (MEVACOR) 40 MG tablet Take 80 mg by mouth every evening. 02/08/15  Yes Historical Provider, MD    ALLERGY: No Known Allergies  ROS: ROS  NEUROLOGIC EXAM: Awake, alert, oriented Memory and concentration grossly intact Speech fluent, appropriate CN grossly intact Motor exam: Upper Extremities Deltoid Bicep Tricep Grip  Right 5/5 5/5 5/5 5/5  Left 5/5 5/5 5/5 5/5   Lower Extremity  IP Quad PF DF EHL  Right 5/5 5/5 5/5 5/5 5/5  Left 5/5 5/5 5/5 5/5 5/5   Sensation grossly intact to LT  IMAGING: No new imaging  IMPRESSION: - 70 y.o. male with posterior fossa cystic mass.  PLAN: - Suboccipital craniectomy for tumor resection - We had a long discussion regarding the risks and benefits and he understands and wishes to proceed.

## 2015-03-15 NOTE — Anesthesia Procedure Notes (Signed)
Procedure Name: Intubation Date/Time: 03/15/2015 8:45 AM Performed by: Lance Coon Pre-anesthesia Checklist: Patient identified, Timeout performed, Emergency Drugs available, Suction available and Patient being monitored Patient Re-evaluated:Patient Re-evaluated prior to inductionOxygen Delivery Method: Circle system utilized Preoxygenation: Pre-oxygenation with 100% oxygen Intubation Type: IV induction Ventilation: Oral airway inserted - appropriate to patient size and Mask ventilation without difficulty Laryngoscope Size: Miller and 3 Grade View: Grade III Tube type: Oral Tube size: 7.5 mm Number of attempts: 1 Airway Equipment and Method: Stylet Placement Confirmation: ETT inserted through vocal cords under direct vision,  positive ETCO2 and breath sounds checked- equal and bilateral Secured at: 22 cm Tube secured with: Tape Dental Injury: Teeth and Oropharynx as per pre-operative assessment

## 2015-03-15 NOTE — Transfer of Care (Signed)
Immediate Anesthesia Transfer of Care Note  Patient: Maxwell Carroll  Procedure(s) Performed: Procedure(s) with comments: Suboccipital craniectomy for tumor resection (N/A) - Suboccipital craniectomy for tumor resection APPLICATION OF CRANIAL NAVIGATION - APPLICATION OF CRANIAL NAVIGATION  Patient Location: PACU  Anesthesia Type:General  Level of Consciousness: awake, alert , oriented and patient cooperative  Airway & Oxygen Therapy: Patient Spontanous Breathing and Patient connected to face mask oxygen  Post-op Assessment: Report given to RN, Post -op Vital signs reviewed and stable and Patient moving all extremities X 4  Post vital signs: Reviewed and stable  Last Vitals:  Filed Vitals:   03/15/15 0636  BP: 185/95  Pulse: 94  Temp: [id number] C    Complications: No apparent anesthesia complications

## 2015-03-16 ENCOUNTER — Inpatient Hospital Stay (HOSPITAL_COMMUNITY): Payer: Medicare Other

## 2015-03-16 ENCOUNTER — Encounter (HOSPITAL_COMMUNITY): Payer: Self-pay | Admitting: Neurological Surgery

## 2015-03-16 DIAGNOSIS — G9389 Other specified disorders of brain: Secondary | ICD-10-CM

## 2015-03-16 DIAGNOSIS — R270 Ataxia, unspecified: Secondary | ICD-10-CM

## 2015-03-16 DIAGNOSIS — R269 Unspecified abnormalities of gait and mobility: Secondary | ICD-10-CM

## 2015-03-16 LAB — BASIC METABOLIC PANEL
Anion gap: 10 (ref 5–15)
BUN: 13 mg/dL (ref 6–20)
CALCIUM: 8.3 mg/dL — AB (ref 8.9–10.3)
CO2: 25 mmol/L (ref 22–32)
CREATININE: 0.93 mg/dL (ref 0.61–1.24)
Chloride: 106 mmol/L (ref 101–111)
GFR calc non Af Amer: >60 mL/min (ref >60)
Glucose, Bld: 161 mg/dL — ABNORMAL HIGH (ref 65–99)
Potassium: 3.9 mmol/L (ref 3.5–5.1)
SODIUM: 141 mmol/L (ref 135–145)

## 2015-03-16 LAB — CBC
HCT: 39.5 % (ref 39.0–52.0)
Hemoglobin: 12.9 g/dL — ABNORMAL LOW (ref 13.0–17.0)
MCH: 29.9 pg (ref 26.0–34.0)
MCHC: 32.7 g/dL (ref 30.0–36.0)
MCV: 91.6 fL (ref 78.0–100.0)
PLATELETS: 224 10*3/uL (ref 150–400)
RBC: 4.31 MIL/uL (ref 4.22–5.81)
RDW: 13 % (ref 11.5–15.5)
WBC: 14.1 10*3/uL — AB (ref 4.0–10.5)

## 2015-03-16 MED ORDER — DIAZEPAM 5 MG PO TABS
5.0000 mg | ORAL_TABLET | Freq: Three times a day (TID) | ORAL | Status: DC | PRN
  Administered 2015-03-16 – 2015-03-17 (×4): 5 mg via ORAL
  Filled 2015-03-16 (×4): qty 1

## 2015-03-16 MED ORDER — GADOBENATE DIMEGLUMINE 529 MG/ML IV SOLN
20.0000 mL | Freq: Once | INTRAVENOUS | Status: AC | PRN
  Administered 2015-03-16: 20 mL via INTRAVENOUS

## 2015-03-16 MED ORDER — DEXAMETHASONE SODIUM PHOSPHATE 4 MG/ML IJ SOLN
4.0000 mg | Freq: Three times a day (TID) | INTRAMUSCULAR | Status: DC
  Administered 2015-03-16 – 2015-03-17 (×3): 4 mg via INTRAVENOUS
  Filled 2015-03-16 (×3): qty 1

## 2015-03-16 MED ORDER — METHOCARBAMOL 500 MG PO TABS
750.0000 mg | ORAL_TABLET | Freq: Four times a day (QID) | ORAL | Status: DC
  Administered 2015-03-16 – 2015-03-17 (×5): 750 mg via ORAL
  Filled 2015-03-16 (×5): qty 2

## 2015-03-16 NOTE — Consult Note (Signed)
Physical Medicine and Rehabilitation Consult Reason for Consult: Left superior cerebellar cystic mass Referring Physician: Dr. Cyndy Freeze   HPI: Maxwell Carroll is a 70 y.o. right handed male with history of hypertension, bilateral knee replacement, remote tobacco abuse. Patient lives with spouse independent prior to admission. One level home 5 steps to entry. Admitted 03/15/2015 with incidentally discovered posterior fossa cystic mass during workup of back pain in January. Underwent suboccipital craniectomy for tumor resection use of stereotactic navigation 03/15/2015 per Dr. Cyndy Freeze. Hospital course pain management. Currently on a clear liquid diet and slowly advanced. Physical occupational therapy evaluation completed 03/16/2015 with recommendations of physical medicine rehabilitation consult.  Patient remains nauseated but he is no longer vomiting. He denies any double vision currently. He has not tried getting out of bed. He does complain of headache. He denies any persistent hiccups Review of Systems  Constitutional: Negative for fever and chills.  HENT: Negative for hearing loss.        Nonspecific headaches  Eyes: Negative for blurred vision and double vision.  Respiratory: Negative for cough and shortness of breath.   Cardiovascular: Negative for chest pain, palpitations and leg swelling.  Gastrointestinal: Positive for constipation. Negative for nausea and vomiting.  Genitourinary: Negative for dysuria.  Musculoskeletal: Positive for myalgias and joint pain.  Neurological: Negative for weakness.       Numbness and tingling in hands  Psychiatric/Behavioral: Positive for depression.  All other systems reviewed and are negative.  Past Medical History  Diagnosis Date  . Hypercholesteremia   . Cataract   . Arthritis   . Hypertension   . Sleep apnea   . History of bronchitis   . Numbness and tingling in hands   . Herniated disc, cervical   . Lumbar herniated disc   . History of  pneumonia   . Depression   . Seasonal allergies   . [name] (abdominal aortic aneurysm) (HCC)     hx of, but no aneurysm on CT 12/01/14   Past Surgical History  Procedure Laterality Date  . Hammer toe surgery    . Replacement total knee Right   . Replacement total knee bilateral    . Eye surgery      cataracts  . Colonoscopy    . Esophagogastroduodenoscopy     History reviewed. No pertinent family history. Social History:  reports that he quit smoking about 27 years ago. He has never used smokeless tobacco. He reports that he drinks alcohol. He reports that he does not use illicit drugs. Allergies: No Known Allergies Medications Prior to Admission  Medication Sig Dispense Refill  . albuterol (PROVENTIL HFA;VENTOLIN HFA) 108 (90 Base) MCG/ACT inhaler Inhale 2 puffs into the lungs every 4 (four) hours as needed for wheezing or shortness of breath.    Marland Kitchen FLUoxetine (PROZAC) 40 MG capsule Take 40 mg by mouth daily.    Marland Kitchen lisinopril (PRINIVIL,ZESTRIL) 10 MG tablet Take 10 mg by mouth daily.  3  . lovastatin (MEVACOR) 40 MG tablet Take 80 mg by mouth every evening.  3    Home: Home Living Family/patient expects to be discharged to:: Private residence Living Arrangements: Spouse/significant other Available Help at Discharge: Family, Available 24 hours/day Type of Home: House Home Access: Stairs to enter [employer] of Steps: 5 Entrance Stairs-Rails: Right Home Layout: One level Biochemist, clinical: [place]: Environmental consultant - 2 wheels, [employer] - single point, Wheelchair - manual Additional Comments: Wife interested in Geneticist, molecular.  Functional History: Prior  Function Level of Independence: Independent Functional Status:  Mobility: Bed Mobility General bed mobility comments: pt sitting in recliner. Transfers Overall transfer level: Needs assistance Equipment used: Rolling walker (2 wheeled) Transfers: Sit to/from Stand Sit to Stand: Min assist General transfer comment:  cues for UE use and controlling descent to sitting.  pt demonstrates good follow through on cueing.  pt endorses dizziness in standing with BP stable.        ADL:    Cognition: Cognition Overall Cognitive Status: Within Functional Limits for tasks assessed Orientation Level: Oriented X4 Cognition Arousal/Alertness: Awake/alert Behavior During Therapy: WFL for tasks assessed/performed Overall Cognitive Status: Within Functional Limits for tasks assessed  Blood pressure 130/66, pulse 81, temperature 98.1 F (36.7 C), temperature source Oral, resp. rate 15, height 6\' 1"  (1.854 m), weight 140.4 kg (309 lb 8.4 oz), SpO2 92 %. Physical Exam  Vitals reviewed. Constitutional: He appears well-developed.  HENT:  Craniectomy site clean and dry  Eyes:  Pupils reactive to light  Neck: Normal range of motion. Neck supple. No thyromegaly present.  Cardiovascular: Normal rate and regular rhythm.   Respiratory: Effort normal and breath sounds normal. No respiratory distress.  GI: Soft. Bowel sounds are normal. He exhibits no distension.  Neurological:   Mood is flat but appropriate. Makes good eye contact with examiner. Oriented to person place and date of birth. Follow simple commands.  motor strength is 5/5 bilateral deltoid, biceps, triceps, grip 4 minus at the hip flexor and knee extensor and ankle dorsiflexor bilaterally Sensation intact to light touch bilateral upper and lower limbs He has past pointing on finger-nose-finger testing on the left side, mild dysmetria bilaterally.  Results for orders placed or performed during the hospital encounter of 03/15/15 (from the past 24 hour(s))  MRSA PCR Screening     Status: None   Collection Time: 03/15/15  8:45 PM  Result Value Ref Range   MRSA by PCR NEGATIVE NEGATIVE  Surgical PCR screen *Canceled*     Status: None ()   Collection Time: 03/15/15  9:30 PM   Narrative   LIS Cancel (ORR/DE = Data Error)  Basic metabolic panel     Status:  Abnormal   Collection Time: 03/16/15  5:45 AM  Result Value Ref Range   Sodium 141 135 - 145 mmol/L   Potassium 3.9 3.5 - 5.1 mmol/L   Chloride 106 101 - 111 mmol/L   CO2 25 22 - 32 mmol/L   Glucose, Bld 161 (H) 65 - 99 mg/dL   BUN 13 6 - 20 mg/dL   Creatinine, Ser 0.93 0.61 - 1.24 mg/dL   Calcium 8.3 (L) 8.9 - 10.3 mg/dL   GFR calc non Af Amer >60 >60 mL/min   GFR calc Af Amer >60 >60 mL/min   Anion gap 10 5 - 15  CBC     Status: Abnormal   Collection Time: 03/16/15  5:45 AM  Result Value Ref Range   WBC 14.1 (H) 4.0 - 10.5 K/uL   RBC 4.31 4.22 - 5.81 MIL/uL   Hemoglobin 12.9 (L) 13.0 - 17.0 g/dL   HCT 39.5 39.0 - 52.0 %   MCV 91.6 78.0 - 100.0 fL   MCH 29.9 26.0 - 34.0 pg   MCHC 32.7 30.0 - 36.0 g/dL   RDW 13.0 11.5 - 15.5 %   Platelets 224 150 - 400 K/uL   No results found.  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  Does the need for close, 24 hr/day medical supervision in concert with the patient's rehab needs make it unreasonable for this patient to be served in a less intensive setting? Yes 2. Co-Morbidities requiring supervision/potential complications: history hypertension, status post recent suboccipital craniotomy 3. Due to bladder management, bowel management, safety, skin/wound care, disease management, medication administration, pain management and patient education, does the patient require 24 hr/day rehab nursing? Yes 4. Does the patient require coordinated care of a physician, rehab nurse, PT (1-2 hrs/day, 5 days/week), OT (1-2 hrs/day, 5 days/week) and SLP (0.5-1 hrs/day, 5 days/week) to address physical and functional deficits in the context of the above medical diagnosis(es)? Yes Addressing deficits in the following areas: balance, endurance, locomotion, strength, transferring, bowel/bladder control, bathing, dressing, feeding, grooming, toileting, cognition, speech, language, swallowing  and psychosocial support 5. Can the patient actively participate in an intensive therapy program of at least 3 hrs of therapy per day at least 5 days per week? Yes 6. The potential for patient to make measurable gains while on inpatient rehab is good 7. Anticipated functional outcomes upon discharge from inpatient rehab are supervision  with PT, supervision with OT, supervision with SLP. 8. Estimated rehab length of stay to reach the above functional goals is: 14-17 days 9. Does the patient have adequate social supports and living environment to accommodate these discharge functional goals? Yes and Potentially 10. Anticipated D/C setting: Home 11. Anticipated post D/C treatments: [place] therapy 12. Overall Rehab/Functional Prognosis: good  RECOMMENDATIONS: This patient's condition is appropriate for continued rehabilitative care in the following setting: CIR Patient has agreed to participate in recommended program. Yes Note that insurance prior authorization may be required for reimbursement for recommended care.  Comment: May benefit from scopolamine patch for motion sensitivity    03/16/2015

## 2015-03-16 NOTE — Progress Notes (Signed)
No acute events AVSS Awake and alert Moving all extremities well Incision with some dried blood, dressing intact Stable Doing well Will move out if MRI looks good

## 2015-03-16 NOTE — Evaluation (Signed)
Occupational Therapy Evaluation Patient Details Name: Maxwell Carroll MRN: [id number] DOB: 11/18/1945 Today's Date: 03/16/2015    History of Present Illness pt presents s/p Suboccipital Crani for Resection of L Cerebellar Mass.  [name], [name], [name], Depression, [name].     Clinical Impression   Pt admitted with above. He demonstrates the below listed deficits and will benefit from continued OT to maximize safety and independence with BADLs.  Pt presents to OT with headache, dizziness, impaired balance, visual deficits which include nystagmus and diplopia, dysmetria of UEs.   Activity limited on eval due to headache, but pt is very motivated to regain independence and anticipate good progress.  Feel he would be an excellent candidate for CIR      Follow Up Recommendations  CIR;Supervision/Assistance - 24 hour    Equipment Recommendations  3 in 1 bedside comode;Tub/shower seat    Recommendations for Other Services       Precautions / Restrictions Precautions Precautions: Fall      Mobility Bed Mobility Overal bed mobility: Needs Assistance Bed Mobility: Rolling Rolling: Min assist            Transfers                 General transfer comment: Pt deferred due to headache     Balance                                            ADL Overall ADL's : Needs assistance/impaired Eating/Feeding: Set up;Bed level   Grooming: Wash/dry hands;Wash/dry face;Oral care;Brushing hair;Minimal assistance;Bed level   Upper Body Bathing: Moderate assistance;Bed level   Lower Body Bathing: Total assistance;Bed level   Upper Body Dressing : Total assistance;Bed level   Lower Body Dressing: Total assistance;Bed level   Toilet Transfer: Total assistance Toilet Transfer Details (indicate cue type and reason): Pt unable to tolerate at this time due to headache  Toileting- Clothing Manipulation and Hygiene: Total assistance;Sit to/from stand          General ADL Comments: Activity limited due to headache this pm      Vision Vision Assessment?: Yes Eye Alignment: Impaired (comment) Ocular Range of Motion: Within Functional Limits Visual Fields: No apparent deficits Diplopia Assessment: Objects split side to side Additional Comments: Pt with nystagmus noted both eyes.  He reports shadowing and diplopia when looking at object.  Rt eye drfts to midline with tracking into Rt quadrants    Perception Perception Perception Tested?: Yes   Praxis Praxis Praxis tested?: Within functional limits    Pertinent Vitals/Pain Pain Assessment: 0-10 Pain Score: 10-Worst pain ever Pain Location: headache Pain Descriptors / Indicators: Headache;Squeezing Pain Intervention(s): Limited activity within patient's tolerance;Premedicated before session;Ice applied     Hand Dominance     Extremity/Trunk Assessment Upper Extremity Assessment Upper Extremity Assessment: Generalized weakness;RUE deficits/detail;LUE deficits/detail RUE Deficits / Details: dysmetria noted  RUE Coordination: decreased gross motor LUE Deficits / Details: dysmetria noted  LUE Coordination: decreased gross motor   Lower Extremity Assessment Lower Extremity Assessment: Defer to PT evaluation       Communication Communication Communication: No difficulties   Cognition Arousal/Alertness: Awake/alert Behavior During Therapy: WFL for tasks assessed/performed Overall Cognitive Status: Within Functional Limits for tasks assessed  General Comments       Exercises       Shoulder Instructions      Home Living Family/patient expects to be discharged to:: Private residence Living Arrangements: Spouse/significant other Available Help at Discharge: Family;Available 24 hours/day Type of Home: House Home Access: Stairs to enter [employer] of Steps: 5 Entrance Stairs-Rails: Right Home Layout: One level         Bathroom  Toilet: Standard     Home Equipment: Walker - 2 wheels;Cane - single point;Wheelchair - manual   Additional Comments: Wife interested in Geneticist, molecular.      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     OT Diagnosis: Generalized weakness;Disturbance of vision;Ataxia   OT Problem List: Decreased strength;Decreased activity tolerance;Impaired balance (sitting and/or standing);Impaired vision/perception;Decreased coordination;Decreased knowledge of use of DME or AE;Impaired UE functional use;Pain   OT Treatment/Interventions: Self-care/ADL training;Neuromuscular education;DME and/or AE instruction;Therapeutic activities;Visual/perceptual remediation/compensation;Patient/family education;Balance training    OT Goals(Current goals can be found in the care plan section) Acute Rehab OT Goals Patient Stated Goal: to get better  OT Goal Formulation: With patient Time For Goal Achievement: 03/30/15 Potential to Achieve Goals: Good ADL Goals Pt Will Perform Grooming: with supervision;standing Pt Will Perform Upper Body Bathing: with set-up;sitting Pt Will Perform Lower Body Bathing: with supervision;sit to/from stand Pt Will Perform Upper Body Dressing: with set-up;sitting Pt Will Perform Lower Body Dressing: with supervision;sit to/from stand Pt Will Transfer to Toilet: with min guard assist;ambulating;regular height toilet;bedside commode Pt Will Perform Toileting - Clothing Manipulation and hygiene: with supervision;sit to/from stand Additional ADL Goal #1: Pt will be independent with vision HEP  OT Frequency: Min 3X/week   Barriers to D/C:            Co-evaluation              End of Session Nurse Communication: Mobility status  Activity Tolerance: Patient limited by pain Patient left: in bed;with call bell/phone within reach   Time: 1710-1732 OT Time  Calculation (min): 22 min Charges:  OT General Charges $OT Visit: 1 Procedure OT Evaluation $OT Eval Moderate Complexity: 1 Procedure G-Codes:    Kaydyn Sayas M 04/12/2015, 6:02 PM

## 2015-03-16 NOTE — Op Note (Signed)
Tamala Fothergill, MD Physician Signed Neurosurgery Progress Notes 03/15/2015 8:03 PM    Expand All Collapse All   03/15/2015  8:04 PM  PATIENT: Maxwell Carroll 70 y.o. 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 I recommended resection. Patient understood the risks, benefits, and alternatives and potential outcomes and wished to proceed.  PROCEDURE DETAILS: After smooth induction of general endotracheal anesthesia the patient was placed in [place] pins and turned prone onto chest rolls. The head was secured to the operating table. Intraoperative stereotactic navigation was then registered and it's accuracy confirmed. The skin was wiped down with alcohol. The planned incision was determined using stereotactic navigation. The area of the planned incision was anesthetized with lidocaine and Marcaine with epinephrine. The patient was then prepped and draped in the usual sterile fashion.  A linear incision was made along the midline in the suboccipital area and extended above the inion. Subperiosteal dissection was performed to expose the suboccipital area. Using a high-speed bur and Kerrison rongeurs a sizable craniectomy was made which approached the torcula and transverse sinuses bilaterally.  I sharply open the dura with a Y-shaped incision and reflected the superior flap towards the torcula. I resected a small amount of left cerebellar hemisphere cortex adjacent to the Vermis and dissected deep to the surface. Encountered the cyst which was opened with bipolar and suction. Using stereotactic navigation I was able to  identify the tumor nodule. Using microsurgical technique and bipolar cautery and pituitary punches I resected the tumor. Initial specimens were sent for frozen section analysis which were consistent with lesional tissue.   Hemostasis was obtained. I irrigated with bacitracin saline. I closed the inferior limb of the dural incision primarily. The superior limbs could not be closed. I placed an inlay and onlay of DuraMatrix. I then sealed this with DuraSeal. I irrigated again with bacitracin saline. The wound was then closed in routine anatomic layers with interrupted Vicryl sutures. The skin was closed with a running locked nylon suture. A sterile occlusive dressing was placed. The patient was returned prone to the stretcher in the [place] pins were removed. He awoke uneventfully.   PATIENT DISPOSITION: PACU then ICU  Delay start of Pharmacological VTE agent (>24hrs) due to surgical blood loss or risk of bleeding: Yes      This note was copied from a note that was entered incorrectly last night as a progress note instead of an operative note.

## 2015-03-16 NOTE — Progress Notes (Signed)
OT Cancellation Note  Patient Details Name: Maxwell Carroll MRN: [id number] DOB: January 13, 1946   Cancelled Treatment:    Reason Eval/Treat Not Completed: Patient at procedure or test/ unavailable  Darlina Rumpf [place], OTR/L [id number]  03/16/2015, 12:44 PM

## 2015-03-16 NOTE — Progress Notes (Signed)
Rehab Admissions Coordinator Note:  Patient was screened by Retta Diones for appropriateness for an Inpatient Acute Rehab Consult.  At this time, we are recommending Inpatient Rehab consult.  Retta Diones 03/16/2015, 12:46 PM  I can be reached at [phone].

## 2015-03-16 NOTE — Care Management Note (Signed)
Case Management Note  Patient Details  Name: Maxwell Carroll MRN: [id number] Date of Birth: Jul 01, 1945  Subjective/Objective:    Pt admitted on 03/15/15 s/p craniectomy for tumor resection.  PTA, pt independent, lives with spouse.                  Action/Plan: Will follow for discharge planning as pt progresses.  PT recommending CIR at this time.  Will follow progress.    Expected Discharge Date:                  Expected Discharge Plan:  [place]  In-House Referral:     Discharge planning Services  CM Consult  Post Acute Care Choice:    Choice offered to:     DME Arranged:    DME Agency:     HH Arranged:    [place] Agency:     Status of Service:  In process, will continue to follow  Medicare Important Message Given:    Date Medicare IM Given:    Medicare IM give by:    Date Additional Medicare IM Given:    Additional Medicare Important Message give by:     If discussed at [place] of Stay Meetings, dates discussed:    Additional Comments:  Reinaldo Raddle, RN, BSN  Trauma/Neuro ICU Case Manager [phone]

## 2015-03-16 NOTE — Evaluation (Signed)
Physical Therapy Evaluation Patient Details Name: Maxwell Carroll MRN: [id number] DOB: 1945-12-25 Today's Date: 03/16/2015   History of Present Illness  pt presents s/p Suboccipital Crani for Resection of L Cerebellar Mass.  [name], [name], [name], Depression, [name].    Clinical Impression  Pt very painful and endorses dizziness with mobility.  Pt very motivated to improve mobility to return to home, but at this time feel pt may benefit from CIR at D/C to maximize independence prior to returning to home with wife.  Will continue to follow.      Follow Up Recommendations CIR    Equipment Recommendations  None recommended by PT    Recommendations for Other Services Rehab consult     Precautions / Restrictions Precautions Precautions: Fall Restrictions Weight Bearing Restrictions: No      Mobility  Bed Mobility               General bed mobility comments: pt sitting in recliner.  Transfers Overall transfer level: Needs assistance Equipment used: Rolling walker (2 wheeled) Transfers: Sit to/from Stand Sit to Stand: Min assist         General transfer comment: cues for UE use and controlling descent to sitting.  pt demonstrates good follow through on cueing.  pt endorses dizziness in standing with BP stable.    Ambulation/Gait                Stairs            Wheelchair Mobility    Modified Rankin (Stroke Patients Only)       Balance Overall balance assessment: Needs assistance Sitting-balance support: Feet supported;Single extremity supported;Bilateral upper extremity supported Sitting balance-Leahy Scale: Poor     Standing balance support: Bilateral upper extremity supported;During functional activity Standing balance-Leahy Scale: Poor                               Pertinent Vitals/Pain Pain Assessment: 0-10 Pain Score: 10-Worst pain ever Pain Location: Head and neck Pain Descriptors / Indicators:  Aching;Grimacing;Pressure Pain Intervention(s): Monitored during session;Premedicated before session;Repositioned;RN gave pain meds during session    Home Living Family/patient expects to be discharged to:: Private residence Living Arrangements: Spouse/significant other Available Help at Discharge: Family;Available 24 hours/day Type of Home: House Home Access: Stairs to enter Entrance Stairs-Rails: Right Entrance Stairs-Number of Steps: 5 Home Layout: One level Home Equipment: Walker - 2 wheels;Cane - single point;Wheelchair - manual Additional Comments: Wife interested in Geneticist, molecular.    Prior Function Level of Independence: Independent               Hand Dominance        Extremity/Trunk Assessment   Upper Extremity Assessment: Defer to OT evaluation           Lower Extremity Assessment: Generalized weakness      Cervical / Trunk Assessment: Normal  Communication   Communication: No difficulties  Cognition Arousal/Alertness: Awake/alert Behavior During Therapy: WFL for tasks assessed/performed Overall Cognitive Status: Within Functional Limits for tasks assessed                      General Comments      Exercises        Assessment/Plan    PT Assessment Patient needs continued PT services  PT Diagnosis Difficulty walking;Generalized weakness;Acute pain   PT Problem List Decreased strength;Decreased activity tolerance;Decreased balance;Decreased mobility;Decreased  knowledge of use of DME;Obesity;Pain  PT Treatment Interventions DME instruction;Gait training;Stair training;Functional mobility training;Therapeutic activities;Therapeutic exercise;Balance training;Neuromuscular re-education;Patient/family education   PT Goals (Current goals can be found in the Care Plan section) Acute Rehab PT Goals Patient Stated Goal: Stop hurting. PT Goal Formulation: With patient/family Time For Goal Achievement: 03/30/15 Potential to Achieve Goals:  Good    Frequency Min 4X/week   Barriers to discharge        Co-evaluation               End of Session Equipment Utilized During Treatment: Gait belt;Oxygen Activity Tolerance: Patient limited by pain Patient left: in chair;with call bell/phone within reach;with family/visitor present Nurse Communication: Mobility status         Time: [id number] PT Time Calculation (min) (ACUTE ONLY): 23 min   Charges:   PT Evaluation $PT Eval Moderate Complexity: 1 Procedure PT Treatments $Therapeutic Activity: 8-22 mins   PT G CodesCatarina Hartshorn, [place] [id number] 03/16/2015, 11:16 AM

## 2015-03-17 ENCOUNTER — Inpatient Hospital Stay (HOSPITAL_COMMUNITY)
Admission: RE | Admit: 2015-03-17 | Discharge: 2015-03-24 | DRG: 060 | Disposition: A | Discharge status: Home / Self Care | Payer: Medicare Other | Source: Intra-hospital | Attending: Physical Medicine & Rehabilitation | Admitting: Physical Medicine & Rehabilitation

## 2015-03-17 ENCOUNTER — Encounter (HOSPITAL_COMMUNITY): Payer: Self-pay | Admitting: Neurology

## 2015-03-17 DIAGNOSIS — R270 Ataxia, unspecified: Secondary | ICD-10-CM | POA: Diagnosis not present

## 2015-03-17 DIAGNOSIS — F419 Anxiety disorder, unspecified: Secondary | ICD-10-CM

## 2015-03-17 DIAGNOSIS — G9389 Other specified disorders of brain: Secondary | ICD-10-CM

## 2015-03-17 DIAGNOSIS — Z87891 Personal history of nicotine dependence: Secondary | ICD-10-CM | POA: Diagnosis not present

## 2015-03-17 DIAGNOSIS — K5900 Constipation, unspecified: Secondary | ICD-10-CM | POA: Diagnosis not present

## 2015-03-17 DIAGNOSIS — R51 Headache: Secondary | ICD-10-CM

## 2015-03-17 DIAGNOSIS — E785 Hyperlipidemia, unspecified: Secondary | ICD-10-CM

## 2015-03-17 DIAGNOSIS — R062 Wheezing: Secondary | ICD-10-CM | POA: Diagnosis not present

## 2015-03-17 DIAGNOSIS — D72829 Elevated white blood cell count, unspecified: Secondary | ICD-10-CM | POA: Diagnosis not present

## 2015-03-17 DIAGNOSIS — G4459 Other complicated headache syndrome: Secondary | ICD-10-CM | POA: Insufficient documentation

## 2015-03-17 DIAGNOSIS — I10 Essential (primary) hypertension: Secondary | ICD-10-CM | POA: Insufficient documentation

## 2015-03-17 DIAGNOSIS — F329 Major depressive disorder, single episode, unspecified: Secondary | ICD-10-CM | POA: Diagnosis not present

## 2015-03-17 DIAGNOSIS — Z96653 Presence of artificial knee joint, bilateral: Secondary | ICD-10-CM | POA: Diagnosis not present

## 2015-03-17 DIAGNOSIS — N400 Enlarged prostate without lower urinary tract symptoms: Secondary | ICD-10-CM

## 2015-03-17 DIAGNOSIS — H532 Diplopia: Secondary | ICD-10-CM

## 2015-03-17 DIAGNOSIS — G441 Vascular headache, not elsewhere classified: Secondary | ICD-10-CM | POA: Diagnosis not present

## 2015-03-17 DIAGNOSIS — R269 Unspecified abnormalities of gait and mobility: Secondary | ICD-10-CM | POA: Diagnosis not present

## 2015-03-17 DIAGNOSIS — Z79899 Other long term (current) drug therapy: Secondary | ICD-10-CM | POA: Diagnosis not present

## 2015-03-17 DIAGNOSIS — G119 Hereditary ataxia, unspecified: Secondary | ICD-10-CM | POA: Diagnosis present

## 2015-03-17 MED ORDER — TAMSULOSIN HCL 0.4 MG PO CAPS: 0.4000 mg | ORAL_CAPSULE | Freq: Every day | ORAL | Status: DC

## 2015-03-17 MED ORDER — FLUOXETINE HCL 20 MG PO CAPS
40.0000 mg | ORAL_CAPSULE | Freq: Every day | ORAL | Status: DC
  Administered 2015-03-18 – 2015-03-24 (×7): 40 mg via ORAL
  Filled 2015-03-17 (×8): qty 2

## 2015-03-17 MED ORDER — LISINOPRIL 10 MG PO TABS
10.0000 mg | ORAL_TABLET | Freq: Every day | ORAL | Status: DC
  Administered 2015-03-18 – 2015-03-20 (×3): 10 mg via ORAL
  Filled 2015-03-17 (×3): qty 1

## 2015-03-17 MED ORDER — DOCUSATE SODIUM 100 MG PO CAPS: 100.0000 mg | ORAL_CAPSULE | Freq: Two times a day (BID) | ORAL | Status: AC

## 2015-03-17 MED ORDER — TAMSULOSIN HCL 0.4 MG PO CAPS
0.4000 mg | ORAL_CAPSULE | Freq: Every day | ORAL | Status: DC
  Administered 2015-03-18 – 2015-03-24 (×7): 0.4 mg via ORAL
  Filled 2015-03-17 (×7): qty 1

## 2015-03-17 MED ORDER — SENNA 8.6 MG PO TABS
1.0000 | ORAL_TABLET | Freq: Two times a day (BID) | ORAL | Status: DC
  Administered 2015-03-17 – 2015-03-24 (×12): 8.6 mg via ORAL
  Filled 2015-03-17 (×14): qty 1

## 2015-03-17 MED ORDER — DEXAMETHASONE 4 MG PO TABS
4.0000 mg | ORAL_TABLET | Freq: Two times a day (BID) | ORAL | Status: DC
  Administered 2015-03-17: 4 mg via ORAL
  Filled 2015-03-17: qty 1

## 2015-03-17 MED ORDER — DEXAMETHASONE 4 MG PO TABS
4.0000 mg | ORAL_TABLET | Freq: Two times a day (BID) | ORAL | Status: DC
Start: 2015-03-17 — End: 2015-03-20
  Administered 2015-03-17 – 2015-03-20 (×6): 4 mg via ORAL
  Filled 2015-03-17 (×6): qty 1

## 2015-03-17 MED ORDER — SENNA 8.6 MG PO TABS: 1.0000 | ORAL_TABLET | Freq: Two times a day (BID) | ORAL | Status: DC

## 2015-03-17 MED ORDER — ONDANSETRON HCL 4 MG/2ML IJ SOLN: 4.0000 mg | Freq: Four times a day (QID) | INTRAMUSCULAR | Status: DC | PRN

## 2015-03-17 MED ORDER — PANTOPRAZOLE SODIUM 40 MG IV SOLR
40.0000 mg | Freq: Every day | INTRAVENOUS | Status: DC
  Administered 2015-03-17: 40 mg via INTRAVENOUS
  Filled 2015-03-17: qty 40

## 2015-03-17 MED ORDER — ALBUTEROL SULFATE (2.5 MG/3ML) 0.083% IN NEBU: 3.0000 mL | INHALATION_SOLUTION | RESPIRATORY_TRACT | Status: DC | PRN

## 2015-03-17 MED ORDER — HYDROCODONE-ACETAMINOPHEN 5-325 MG PO TABS
1.0000 | ORAL_TABLET | ORAL | Status: DC | PRN
  Administered 2015-03-17 – 2015-03-18 (×3): 1 via ORAL
  Filled 2015-03-17 (×3): qty 1

## 2015-03-17 MED ORDER — DEXAMETHASONE 4 MG PO TABS: 4.0000 mg | ORAL_TABLET | Freq: Every day | ORAL | Status: DC

## 2015-03-17 MED ORDER — TAMSULOSIN HCL 0.4 MG PO CAPS
0.8000 mg | ORAL_CAPSULE | Freq: Once | ORAL | Status: AC
  Administered 2015-03-17: 0.8 mg via ORAL
  Filled 2015-03-17: qty 2

## 2015-03-17 MED ORDER — ACETAMINOPHEN 325 MG PO TABS
325.0000 mg | ORAL_TABLET | ORAL | Status: DC | PRN
  Administered 2015-03-21: 650 mg via ORAL
  Filled 2015-03-17: qty 2

## 2015-03-17 MED ORDER — BISACODYL 5 MG PO TBEC: 5.0000 mg | DELAYED_RELEASE_TABLET | Freq: Every day | ORAL | Status: DC | PRN

## 2015-03-17 MED ORDER — SORBITOL 70 % SOLN
30.0000 mL | Freq: Every day | Status: DC | PRN
  Administered 2015-03-18 – 2015-03-22 (×3): 30 mL via ORAL
  Filled 2015-03-17: qty 30

## 2015-03-17 MED ORDER — PRAVASTATIN SODIUM 20 MG PO TABS
10.0000 mg | ORAL_TABLET | Freq: Every day | ORAL | Status: DC
  Administered 2015-03-18 – 2015-03-23 (×6): 10 mg via ORAL
  Filled 2015-03-17 (×6): qty 1

## 2015-03-17 MED ORDER — ONDANSETRON HCL 4 MG PO TABS
4.0000 mg | ORAL_TABLET | Freq: Four times a day (QID) | ORAL | Status: DC | PRN
  Administered 2015-03-18: 4 mg via ORAL
  Filled 2015-03-17: qty 1

## 2015-03-17 MED ORDER — METHOCARBAMOL 750 MG PO TABS: 750.0000 mg | ORAL_TABLET | Freq: Four times a day (QID) | ORAL | Status: DC

## 2015-03-17 NOTE — Interval H&P Note (Signed)
Maxwell Carroll was admitted today to Inpatient Rehabilitation with the diagnosis of left cerebellar mass.  The patient's history has been reviewed, patient examined, and there is no change in status.  Patient continues to be appropriate for intensive inpatient rehabilitation.  I have reviewed the patient's chart and labs.  Questions were answered to the patient's satisfaction. The PAPE has been2 reviewed and assessment remains appropriate.  [name] T 03/17/2015, 10:21 PM

## 2015-03-17 NOTE — H&P (View-Only) (Signed)
Physical Medicine and Rehabilitation Admission H&P    Chief complaint: Headache  HPI: Maxwell Carroll is a 71 y.o. right handed male with history of hypertension, bilateral knee replacement, remote tobacco abuse. Patient lives with spouse independent prior to admission. One level home 5 steps to entry. Admitted 03/15/2015 with incidentally discovered posterior fossa cystic mass during workup of back pain in January. Underwent suboccipital craniectomy for tumor resection use of stereotactic navigation 03/15/2015 per Dr. Cyndy Freeze. Hospital course pain management. Currently on a clear liquid diet due to nausea and slowly advanced. Follow-up MRI of the brain 03/16/2015 negative for hydrocephalus or acute infarct. Decadron protocol as directed and taper over the next week. Physical and occupational therapy evaluations completed 03/16/2015 with recommendations of physical medicine rehabilitation consult. Patient was admitted for a comprehensive rehabilitation program.  ROS Constitutional: Negative for fever and chills.  HENT: Negative for hearing loss.   Nonspecific headaches  Eyes: Negative for blurred vision and double vision.  Respiratory: Negative for cough and shortness of breath.  Cardiovascular: Negative for chest pain, palpitations and leg swelling.  Gastrointestinal: Positive for constipation. Negative for nausea and vomiting.  Genitourinary: Negative for dysuria.  Musculoskeletal: Positive for myalgias and joint pain.  Neurological: Negative for weakness.   Numbness and tingling in hands  Psychiatric/Behavioral: Positive for depression.  All other systems reviewed and are negative   Past Medical History  Diagnosis Date  . Hypercholesteremia   . Cataract   . Arthritis   . Hypertension   . Sleep apnea   . History of bronchitis   . Numbness and tingling in hands   . Herniated disc, cervical   . Lumbar herniated disc   . History of pneumonia   . Depression   . Seasonal  allergies   . [name] (abdominal aortic aneurysm) (HCC)     hx of, but no aneurysm on CT 12/01/14   Past Surgical History  Procedure Laterality Date  . Hammer toe surgery    . Replacement total knee Right   . Replacement total knee bilateral    . Eye surgery      cataracts  . Colonoscopy    . Esophagogastroduodenoscopy    . Craniotomy N/A 03/15/2015    Procedure: Suboccipital craniectomy for tumor resection;  Surgeon: Kevan Ny Ditty, MD;  Location: [facility];  Service: Neurosurgery;  Laterality: N/A;  Suboccipital craniectomy for tumor resection  . Application of cranial navigation  03/15/2015    Procedure: APPLICATION OF CRANIAL NAVIGATION;  Surgeon: Kevan Ny Ditty, MD;  Location: [facility];  Service: Neurosurgery;;  APPLICATION OF CRANIAL NAVIGATION   History reviewed. No pertinent family history. Social History:  reports that he quit smoking about 27 years ago. He has never used smokeless tobacco. He reports that he drinks alcohol. He reports that he does not use illicit drugs. Allergies: No Known Allergies Medications Prior to Admission  Medication Sig Dispense Refill  . albuterol (PROVENTIL HFA;VENTOLIN HFA) 108 (90 Base) MCG/ACT inhaler Inhale 2 puffs into the lungs every 4 (four) hours as needed for wheezing or shortness of breath.    Marland Kitchen FLUoxetine (PROZAC) 40 MG capsule Take 40 mg by mouth daily.    Marland Kitchen lisinopril (PRINIVIL,ZESTRIL) 10 MG tablet Take 10 mg by mouth daily.  3  . lovastatin (MEVACOR) 40 MG tablet Take 80 mg by mouth every evening.  3    Home: Home Living Family/patient expects to be discharged to:: Private residence Living Arrangements: Spouse/significant other Available Help at  Discharge: Family, Available 24 hours/day Type of Home: House Home Access: Stairs to enter [employer] of Steps: 5 Entrance Stairs-Rails: Right Home Layout: One level Bathroom Toilet: Standard Home Equipment: Environmental consultant - 2 wheels, [employer] - single point, Wheelchair -  manual Additional Comments: Wife interested in Geneticist, molecular.   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 Transfers Overall transfer level: Needs assistance Equipment used: Rolling walker (2 wheeled) Transfers: Sit to/from Stand Sit to Stand: Min assist General transfer comment: Pt deferred due to headache       ADL: ADL Overall ADL's : Needs assistance/impaired Eating/Feeding: Set up, Bed level Grooming: Wash/dry hands, Wash/dry face, Oral care, Brushing hair, Minimal assistance, Bed level Upper Body Bathing: Moderate assistance, Bed level Lower Body Bathing: Total assistance, Bed level Upper Body Dressing : Total assistance, Bed level Lower Body Dressing: Total assistance, Bed level Toilet Transfer: Total assistance Toilet Transfer Details (indicate cue type and reason): Pt unable to tolerate at this time due to headache  Toileting- Clothing Manipulation and Hygiene: Total assistance, Sit to/from stand General ADL Comments: Activity limited due to headache this pm   Cognition: Cognition Overall Cognitive Status: Within Functional Limits for tasks assessed Orientation Level: Oriented X4 Cognition Arousal/Alertness: Awake/alert Behavior During Therapy: WFL for tasks assessed/performed Overall Cognitive Status: Within Functional Limits for tasks assessed  Physical Exam: Blood pressure 137/66, pulse 77, temperature 99.2 F (37.3 C), temperature source Oral, resp. rate 20, height '6\' 1"'$  (1.854 m), weight 140.4 kg (309 lb 8.4 oz), SpO2 90 %. Physical Exam Constitutional: He appears well-developed. Appears fatigued  HENT:  Craniectomy site clean and dry  Eyes:  Pupils reactive to  light  Neck: Normal range of motion. Neck supple. No thyromegaly present.  Cardiovascular: Normal rate and regular rhythm. no murmur Respiratory: Effort normal and breath sounds normal. No respiratory distress.  GI: Soft. Bowel sounds are normal. He exhibits no distension.  Neurological: Makes good eye contact with examiner. Oriented to person place and date of birth. Follow simple commands.  motor strength is 5/5 bilateral deltoid, biceps, triceps, grip 4 minus at the hip flexor and knee extensor and ankle dorsiflexor bilaterally Sensation intact to light touch bilateral upper and lower limbs Diplopia with confrontation, tracks to all fields, dysmetria, left upper and left lower ataxic with general movements. Skin: other than surgical incision is intact Psych: subdued  Results for orders placed or performed during the hospital encounter of 03/15/15 (from the past 48 hour(s))  MRSA PCR Screening     Status: None   Collection Time: 03/15/15  8:45 PM  Result Value Ref Range   MRSA by PCR NEGATIVE NEGATIVE    Comment:        The GeneXpert MRSA Assay (FDA approved for NASAL specimens only), is one component of a comprehensive MRSA colonization surveillance program. It is not intended to diagnose MRSA infection nor to guide or monitor treatment for MRSA infections.   Basic metabolic panel     Status: Abnormal   Collection Time: 03/16/15  5:45 AM  Result Value Ref Range   Sodium 141 135 - 145 mmol/L   Potassium 3.9 3.5 - 5.1 mmol/L   Chloride 106 101 - 111 mmol/L   CO2 25 22 - 32 mmol/L  Glucose, Bld 161 (H) 65 - 99 mg/dL   BUN 13 6 - 20 mg/dL   Creatinine, Ser 0.93 0.61 - 1.24 mg/dL   Calcium 8.3 (L) 8.9 - 10.3 mg/dL   GFR calc non Af Amer >60 >60 mL/min   GFR calc Af Amer >60 >60 mL/min    Comment: (NOTE) The eGFR has been calculated using the CKD EPI equation. This calculation has not been validated in all clinical situations. eGFR's persistently <60 mL/min signify  possible Chronic Kidney Disease.    Anion gap 10 5 - 15  CBC     Status: Abnormal   Collection Time: 03/16/15  5:45 AM  Result Value Ref Range   WBC 14.1 (H) 4.0 - 10.5 K/uL   RBC 4.31 4.22 - 5.81 MIL/uL   Hemoglobin 12.9 (L) 13.0 - 17.0 g/dL   HCT 39.5 39.0 - 52.0 %   MCV 91.6 78.0 - 100.0 fL   MCH 29.9 26.0 - 34.0 pg   MCHC 32.7 30.0 - 36.0 g/dL   RDW 13.0 11.5 - 15.5 %   Platelets 224 150 - 400 K/uL   Mr Jeri Cos Wo Contrast  03/16/2015  CLINICAL DATA:  Cerebellar tumor resection 03/15/2015 EXAM: MRI HEAD WITHOUT AND WITH CONTRAST TECHNIQUE: Multiplanar, multiecho pulse sequences of the brain and surrounding structures were obtained without and with intravenous contrast. CONTRAST:  69m MULTIHANCE GADOBENATE DIMEGLUMINE 529 MG/ML IV SOLN COMPARISON:  MRI 02/22/2015 FINDINGS: Postop day 1 suboccipital craniotomy for cerebellar cyst and tumor resection. Suboccipital fluid collection measures 5 x 7 cm and likely is CSF. Negative for hydrocephalus. No herniation of the cerebellum into the spinal canal. Interval resection of cyst and enhancing nodule in the superior cerebellar vermis. There remains fluid and gas in this space in the midline. The small enhancing nodule is no longer visualized. There is intrinsic T1 shortening in the cerebellar vermis inferiorly compatible with methemoglobin and blood products. There is mild enhancement of the tentorium bilaterally as well as the posterior fossa dura compatible with small subdural hemorrhage and postop enhancement. Negative for acute infarct. Mild chronic microvascular ischemic change in the cerebral white matter. Mucosal edema in the paranasal sinuses. IMPRESSION: Interval resection of superior cerebellar cyst and enhancing nodule. No residual enhancing nodule seen. Postop fluid collection in the surgical cavity contains CSF and blood and measures approximately 2 x 3 cm. There is a small amount of methemoglobin in the cerebellar vermis below the  surgical resection cavity. Suboccipital fluid collection related to recent surgery and CSF measures 5 x 7cm. Negative for hydrocephalus.  No acute infarct. Electronically Signed   By: CFranchot GalloM.D.   On: 03/16/2015 13:30       Medical Problem List and Plan: 1.  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   -Taper Decadron over the next 1 week  -path still pending   -symptom mgt, prn meclizine for n/v/dizziness 2.  DVT Prophylaxis/Anticoagulation: SCDs. Monitor for any signs of DVT.   -mobilize 3. Pain Management: Hydrocodone and Robaxin as needed. Monitor with increased mobility 4. Mood: Prozac 40 mg daily. Team/family to provide emotional support 5. Neuropsych: This patient is capable of making decisions on his own behalf. 6. Skin/Wound Care: Routine skin checks. Continue staples fornow 7. Fluids/Electrolytes/Nutrition: Routine I&O's with follow-up chemistries as part of admission 8. Hypertension. Lisinopril 10 mg daily. Monitor with increased mobility 9. Hyperlipidemia. Pravachol 10.BPH.Flomax 0.4 mg daily.   Post Admission Physician Evaluation: 1. Functional  deficits secondary  to left cerebellar mass s/p resection. 2. Patient is admitted to receive collaborative, interdisciplinary care between the physiatrist, rehab nursing staff, and therapy team. 3. Patient's level of medical complexity and substantial therapy needs in context of that medical necessity cannot be provided at a lesser intensity of care such as a SNF. 4. Patient has experienced substantial functional loss from his/her baseline which was documented above under the "Functional History" and "Functional Status" headings.  Judging by the patient's diagnosis, physical exam, and functional history, the patient has potential for functional progress which will result in measurable gains while on inpatient rehab.  These gains will be of substantial and practical use upon  discharge  in facilitating mobility and self-care at the household level. 5. Physiatrist will provide 24 hour management of medical needs as well as oversight of the therapy plan/treatment and provide guidance as appropriate regarding the interaction of the two. 6. 24 hour rehab nursing will assist with bladder management, bowel management, safety, skin/wound care, disease management, medication administration, pain management and patient education  and help integrate therapy concepts, techniques,education, etc. 7. PT will assess and treat for/with: Lower extremity strength, range of motion, stamina, balance, functional mobility, safety, adaptive techniques and equipment, NMR, vestibular assess and treat, ego support, pain mgt, symptom mgt.   Goals are: mod i to supervision. 8. OT will assess and treat for/with: ADL's, functional mobility, safety, upper extremity strength, adaptive techniques and equipment, NMR, vestibular assess and treat, pain control, community reintegration.   Goals are: supervision to mod I. Therapy may proceed with showering this patient. 9. SLP will assess and treat for/with: n/z.  Goals are: n/z. 10. Case Management and Social Worker will assess and treat for psychological issues and discharge planning. 11. Team conference will be held weekly to assess progress toward goals and to determine barriers to discharge. 12. Patient will receive at least 3 hours of therapy per day at least 5 days per week. 13. ELOS: 8-12 days       14. Prognosis:  good     Meredith Staggers, MD, [facility] 03/17/2015   03/17/2015

## 2015-03-17 NOTE — Progress Notes (Signed)
Report given to [facility]. Pt discharged to 4W bed 8. Pts daughter accompanied pt to new room.

## 2015-03-17 NOTE — Progress Notes (Signed)
Physical Therapy Treatment Patient Details Name: Maxwell Carroll MRN: [id number] DOB: Jul 11, 1945 Today's Date: 03/17/2015    History of Present Illness pt presents s/p Suboccipital Crani for Resection of L Cerebellar Mass.  [name], [name], [name], Depression, [name].      PT Comments    Pt able to make great progress to ambulate today.  Pt continues to require cues for safety and technique throughout session.  Pt needs encouragement as he is very hard on himself despite showing such great progress.  Continue to feel pt would benefit from CIR at D/C to maximize independence prior to returning to home with family.    Follow Up Recommendations  CIR     Equipment Recommendations  None recommended by PT    Recommendations for Other Services       Precautions / Restrictions Precautions Precautions: Fall Restrictions Weight Bearing Restrictions: No    Mobility  Bed Mobility Overal bed mobility: Needs Assistance Bed Mobility: Supine to Sit;Sit to Supine     Supine to sit: Min assist Sit to supine: Min assist   General bed mobility comments: A with bringing trunk up to sitting and for LEs to return to supine.    Transfers Overall transfer level: Needs assistance Equipment used: Rolling walker (2 wheeled) Transfers: Sit to/from Stand Sit to Stand: Min assist Stand pivot transfers: Min assist       General transfer comment: cues for UE use and controlling descent to sitting.    Ambulation/Gait Ambulation/Gait assistance: Min assist;+2 safety/equipment Ambulation Distance (Feet): 25 Feet Assistive device: Rolling walker (2 wheeled) Gait Pattern/deviations: Step-through pattern;Decreased stride length;Wide base of support     General Gait Details: pt moves slowly and very labored.  pt relies heavily on RW and needs cues for more upright posture.     Stairs            Wheelchair Mobility    Modified Rankin (Stroke Patients Only)       Balance  Overall balance assessment: Needs assistance Sitting-balance support: No upper extremity supported;Feet supported Sitting balance-Leahy Scale: Fair     Standing balance support: Bilateral upper extremity supported;During functional activity Standing balance-Leahy Scale: Poor Standing balance comment: requires min A                    Cognition Arousal/Alertness: Awake/alert Behavior During Therapy: WFL for tasks assessed/performed Overall Cognitive Status: Within Functional Limits for tasks assessed                      Exercises Other Exercises Other Exercises: Pt performed 5 reps head/neck rotation slowly, and shoulder flexion AAROM (to reduce pain) x 10 reps.  Pt with only very mild dysmetria bil. UEs     General Comments        Pertinent Vitals/Pain Pain Assessment: 0-10 Pain Score: 10-Worst pain ever Pain Location: Head and neck Pain Descriptors / Indicators: Headache;Aching Pain Intervention(s): Limited activity within patient's tolerance;Monitored during session;Premedicated before session;Repositioned    Home Living                      Prior Function            PT Goals (current goals can now be found in the care plan section) Acute Rehab PT Goals Patient Stated Goal: to get better  PT Goal Formulation: With patient/family Time For Goal Achievement: 03/30/15 Potential to Achieve Goals: Good Progress towards PT  goals: Progressing toward goals    Frequency  Min 4X/week    PT Plan Current plan remains appropriate    Co-evaluation             End of Session Equipment Utilized During Treatment: Gait belt Activity Tolerance: Patient limited by pain Patient left: in bed;with call bell/phone within reach;with family/visitor present     Time: [id number] PT Time Calculation (min) (ACUTE ONLY): 24 min  Charges:  $Gait Training: 8-22 mins $Therapeutic Activity: 8-22 mins                    G CodesCatarina Hartshorn,  [place] 03/17/2015, 11:16 AM

## 2015-03-17 NOTE — Progress Notes (Signed)
PMR Admission Coordinator Pre-Admission Assessment  Patient: Maxwell Carroll is an 70 y.o., male MRN: [id number] DOB: [birth year] Height: 6\' 1"  (185.4 cm) Weight: (!) 140.4 kg (309 lb 8.4 oz)  Insurance Information HMO: PPO: yes PCP: IPA: 80/20: OTHER: medicare replacement policy PRIMARY: [place] Policy#: [id number] Subscriber: pt CM Name: Jenny Reichmann Phone#: [phone] Fax#: [id number] Pre-Cert#: [id number] approved for 7 days. F/u CM Earney Hamburg phone [phone] fax EMR access Employer: retired Benefits: Phone #: [phone] Name: 03/17/15 Eff. 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 right handed male with history of hypertension, bilateral knee replacement, remote tobacco abuse. Patient lives with spouse independent prior to admission. One level home 5 steps to entry. Admitted 03/15/2015 with incidentally discovered posterior fossa cystic mass during workup of back pain in January.  Underwent suboccipital craniectomy for tumor resection use of stereotactic navigation 03/15/2015 per Dr. Cyndy Freeze. Hospital course pain management. Currently on a clear liquid diet due to nausea and slowly advanced. Follow-up MRI of the brain 03/16/2015 negative for hydrocephalus or acute infarct. Decadron protocol as directed and taper over the next week.  Difficulty with urinary retention and began flomax.   Past Medical History  Past Medical History  Diagnosis Date  . Hypercholesteremia   . Cataract   . Arthritis   . Hypertension   . Sleep apnea   . History of bronchitis   . Numbness and tingling in hands   . Herniated disc, cervical   . Lumbar herniated disc   . History of pneumonia   . Depression   . Seasonal allergies   . [name] (abdominal aortic aneurysm) (HCC)     hx of, but no aneurysm on CT 12/01/14    Family History  family history is not on file.  Prior Rehab/Hospitalizations:  Has the patient had major surgery during 100 days prior to admission? No  Current Medications   Current facility-administered medications:  . 0.9 % sodium chloride infusion, , Intravenous, Continuous, Kevan Ny Ditty, MD, Last Rate: 100 mL/hr at 03/17/15 0900 . albuterol (PROVENTIL) (2.5 MG/3ML) 0.083% nebulizer solution 3 mL, 3 mL, Inhalation, Q4H PRN, Kevan Ny Ditty, MD . bisacodyl (DULCOLAX) EC tablet 5 mg, 5 mg, Oral, Daily PRN, Kevan Ny Ditty, MD . dexamethasone (DECADRON) tablet 4 mg, 4 mg, Oral, Q12H, Kevan Ny Ditty, MD, 4 mg at 03/17/15 1014 . diazepam (VALIUM) tablet 5 mg, 5 mg, Oral, Q8H PRN, Kevan Ny Ditty, MD, 5 mg at 03/17/15 0727 . docusate sodium (COLACE) capsule 100 mg, 100 mg, Oral, BID, Kevan Ny Ditty, MD, 100 mg at 03/17/15 1058 . FLUoxetine (PROZAC) capsule 40 mg, 40 mg, Oral, Daily, Kevan Ny Ditty, MD, 40 mg at 03/17/15 1014 . hydrALAZINE (APRESOLINE) injection 5-10 mg, 5-10 mg,  Intravenous, Q1H  PRN, Kevan Ny Ditty, MD, 10 mg at 03/15/15 2305 . HYDROcodone-acetaminophen (NORCO/VICODIN) 5-325 MG per tablet 1 tablet, 1 tablet, Oral, Q4H PRN, Kevan Ny Ditty, MD, 1 tablet at 03/17/15 1301 . HYDROmorphone (DILAUDID) injection 0.5-1 mg, 0.5-1 mg, Intravenous, Q2H PRN, Kevan Ny Ditty, MD, 1 mg at 03/17/15 1408 . lisinopril (PRINIVIL,ZESTRIL) tablet 10 mg, 10 mg, Oral, Daily, Kevan Ny Ditty, MD, 10 mg at 03/17/15 1014 . methocarbamol (ROBAXIN) tablet 750 mg, 750 mg, Oral, Q6H, Kevan Ny Ditty, MD, 750 mg at 03/17/15 1057 . naloxone [facility]) injection 0.08 mg, 0.08 mg, Intravenous, PRN, Kevan Ny Ditty, MD . nicardipine (CARDENE) 20mg  in 0.86% saline 218ml IV infusion (0.1 mg/ml), 3-15 mg/hr, Intravenous, Continuous, Kevan Ny Ditty, MD . ondansetron [facility]) tablet 4 mg, 4 mg, Oral, Q4H PRN **OR** ondansetron (ZOFRAN) injection 4 mg, 4 mg, Intravenous, Q4H PRN, Kevan Ny Ditty, MD, 4 mg at 03/16/15 0813 . pantoprazole (PROTONIX) injection 40 mg, 40 mg, Intravenous, QHS, Kevan Ny Ditty, MD, 40 mg at 03/16/15 2147 . pravastatin (PRAVACHOL) tablet 10 mg, 10 mg, Oral, q1800, Kevan Ny Ditty, MD, 10 mg at 03/16/15 1643 . promethazine (PHENERGAN) tablet 12.5-25 mg, 12.5-25 mg, Oral, Q4H PRN, Kevan Ny Ditty, MD . senna [facility]) tablet 8.6 mg, 1 tablet, Oral, BID, Kevan Ny Ditty, MD, 8.6 mg at 03/17/15 1014 . sodium phosphate (FLEET) 7-19 GM/118ML enema 1 enema, 1 enema, Rectal, Once PRN, Kevan Ny Ditty, MD . 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 Independent  Indoor Mobility: Did the patient need assistance with walking from room to room (with or without device)? Independent  Stairs: Did the patient need assistance with internal or external stairs (with or without device)? Independent  Functional Cognition: Did the patient need help planning regular tasks such as shopping or remembering to take medications? Independent  Current Functional Level Cognition  Overall Cognitive Status: Within Functional Limits for tasks assessed Orientation Level: Oriented X4   Extremity Assessment (includes Sensation/Coordination)  Upper Extremity Assessment: Generalized weakness, RUE deficits/detail, LUE deficits/detail RUE Deficits / Details: dysmetria noted  RUE Coordination: decreased gross motor LUE Deficits / Details: dysmetria noted  LUE Coordination: decreased gross motor  Lower Extremity Assessment: Defer to PT evaluation    ADLs  Overall ADL's : Needs assistance/impaired Eating/Feeding: Set up, Sitting Grooming: Wash/dry hands, Wash/dry face, Minimal assistance, Standing Upper Body Bathing: Minimal assitance, Sitting Lower Body Bathing: Moderate assistance, Sit to/from stand Upper  Body Dressing : Moderate assistance, Sitting Lower Body Dressing: Moderate  assistance, Sit to/from stand Toilet Transfer: Minimal assistance, Ambulation, Comfort height toilet, BSC, RW Toilet Transfer Details (indicate cue type and reason): Pt unable to tolerate at this time due to headache  Toileting- Clothing Manipulation and Hygiene: Minimal assistance, Sit to/from stand Functional mobility during ADLs: Minimal assistance, Moderate assistance General ADL Comments: Pt very motivated. He moves slowly due to pain and dizziness     Mobility  Overal bed mobility: Needs Assistance Bed Mobility: Supine to Sit, Sit to Supine Rolling: Min assist Supine to sit: Min assist Sit to supine: Min assist General bed mobility comments: A with bringing trunk up to sitting and for LEs to return to supine.     Transfers  Overall transfer level: Needs assistance Equipment used: Rolling walker (2 wheeled) Transfers: Sit to/from Stand Sit to Stand: Min assist Stand pivot transfers: Min assist General transfer comment: cues for UE use and controlling descent to sitting.     Ambulation / Gait / Stairs / Wheelchair Mobility  Ambulation/Gait Ambulation/Gait assistance: Min assist, +2 safety/equipment Ambulation Distance (Feet): 25 Feet Assistive device: Rolling walker (2 wheeled) Gait Pattern/deviations: Step-through pattern, Decreased stride length, Wide base of support General Gait Details: pt moves slowly and very labored. pt relies heavily on RW and needs cues for more upright posture.     Posture / Balance Balance Overall balance assessment: Needs assistance Sitting-balance support: No upper extremity supported, Feet supported Sitting balance-Leahy Scale: Fair Standing balance support: Bilateral upper extremity supported, During functional activity Standing balance-Leahy Scale: Poor Standing balance comment: requires min A    Special needs/care consideration BiPAP/CPAP  yes Skin Surgical incision Bowel mgmt: no BM since admit Bladder mgmt: urinary retention/flomax. Diabetic mgmt no   Previous Home Environment Living Arrangements: Spouse/significant other Available Help at Discharge: Family, Available 24 hours/day Type of Home: House Home Layout: One level Home Access: Stairs to enter Entrance Stairs-Rails: Right Entrance Stairs-Number of Steps: 5 Bathroom Shower/Tub: Tub/shower unit, Architectural technologist: Standard Bathroom Accessibility: Yes How Accessible: Accessible via walker Home Care Services: No Additional Comments: Wife interested in Geneticist, molecular.  Discharge Living Setting Plans for Discharge Living Setting: Patient's home, Lives with (comment) (wife) Type of Home at Discharge: House Discharge Home Layout: One level Discharge Home Access: Stairs to enter Entrance Stairs-Rails: Right Entrance Stairs-Number of Steps: 5 Discharge Bathroom Shower/Tub: Tub/shower unit, Curtain Discharge Bathroom Accessibility: Yes How Accessible: Accessible via walker Does the patient have any problems obtaining your medications?: No  Social/Family/Support Systems Patient Roles: Parent, Spouse, Other (Comment) (works Education administrator houses, retired Holiday representative office) Contact Information: Wynonia Musty, wife Anticipated Caregiver: wife Anticipated Caregiver's Contact Information: see above Ability/Limitations of Caregiver: no physical limitations Caregiver Availability: 24/7 Discharge Plan Discussed with Primary Caregiver: Yes Is Caregiver In Agreement with Plan?: Yes Does Caregiver/Family have Issues with Lodging/Transportation while Pt is in Rehab?: No Wife has many concerns financially for he is retired and they [employer] houses on the side only not as a business.  Goals/Additional Needs Patient/Family Goal for Rehab: supervision with PT, OT, and SLP Expected length of stay: ELOS 14-17 days Pt/Family Agrees to Admission and willing to  participate: Yes Program Orientation Provided & Reviewed with Pt/Caregiver Including Roles & Responsibilities: Yes  Decrease burden of Care through IP rehab admission: n/a  Possible need for SNF placement upon discharge: not anticipated  Patient Condition: This patient's condition remains as documented in the consult dated 03/17/2015, in which the Rehabilitation Physician determined and documented that the patient's condition is appropriate for intensive  rehabilitative care in an inpatient rehabilitation facility. Will admit to inpatient rehab today.  Preadmission Screen Completed By: Cleatrice Burke, 03/17/2015 2:30 PM ______________________________________________________________________  Discussed status with Dr. Naaman Plummer on 03/17/2015 at 1430 and received telephone approval for admission today.  Admission Coordinator: Cleatrice Burke, time [id number] Date 03/16/2105.          Cosigned by: Meredith Staggers, MD at 03/17/2015 2:36 PM  Revision History     Date/Time User Provider Type Action   03/17/2015 2:36 PM Meredith Staggers, MD Physician Cosign   03/17/2015 2:31 PM Cristina Gong, RN Rehab Admission Coordinator Sign

## 2015-03-17 NOTE — Progress Notes (Signed)
Occupational Therapy Treatment Patient Details Name: Maxwell Carroll MRN: [id number] DOB: Sep 02, 1945 Today's Date: 03/17/2015    History of present illness pt presents s/p Suboccipital Crani for Resection of L Cerebellar Mass.  [name], [name], [name], Depression, [name].     OT comments  Pt with significant improvements today, but pain still is an issue 9/10 headache.  Less nystagmus noted with intermittent diplopia in the Lt and Rt superior quadrants.  He requires min - mod A for ADLs, and is highly motivated.  He will be an excellent candidate for CIR - expect good progress.   Follow Up Recommendations  CIR;Supervision/Assistance - 24 hour    Equipment Recommendations  3 in 1 bedside comode;Tub/shower seat    Recommendations for Other Services      Precautions / Restrictions Precautions Precautions: Fall       Mobility Bed Mobility Overal bed mobility: Needs Assistance Bed Mobility: Sit to Supine       Sit to supine: Min assist   General bed mobility comments: Pt requires min A for LEs   Transfers Overall transfer level: Needs assistance Equipment used: 1 person hand held assist Transfers: Sit to/from [facility] Sit to Stand: Min assist Stand pivot transfers: Min assist       General transfer comment: Pt with wide BOS and somewhat unsteady     Balance Overall balance assessment: Needs assistance Sitting-balance support: Feet supported Sitting balance-Leahy Scale: Fair     Standing balance support: Bilateral upper extremity supported;Single extremity supported;During functional activity Standing balance-Leahy Scale: Poor Standing balance comment: requires min A                   ADL Overall ADL's : Needs assistance/impaired Eating/Feeding: Set up;Sitting   Grooming: Wash/dry hands;Wash/dry face;Minimal assistance;Standing   Upper Body Bathing: Minimal assitance;Sitting   Lower Body Bathing: Moderate assistance;Sit  to/from stand   Upper Body Dressing : Moderate assistance;Sitting   Lower Body Dressing: Moderate assistance;Sit to/from stand   Toilet Transfer: Minimal assistance;Ambulation;Comfort height toilet;BSC;RW   Toileting- Clothing Manipulation and Hygiene: Minimal assistance;Sit to/from stand       Functional mobility during ADLs: Minimal assistance;Moderate assistance General ADL Comments: Pt very motivated.  He moves slowly due to pain and dizziness       Vision                 Additional Comments: Pt demonstrates decreased nystagmus this date.  Diplopia present in Lt and Rt superior quadrant intermittently.  Shadowing noted in central fields    Perception     Praxis      Cognition   Behavior During Therapy: [facility] for tasks assessed/performed Overall Cognitive Status: Within Functional Limits for tasks assessed                       Extremity/Trunk Assessment               Exercises Other Exercises Other Exercises: Pt performed 5 reps head/neck rotation slowly, and shoulder flexion AAROM (to reduce pain) x 10 reps.  Pt with only very mild dysmetria bil. UEs    Shoulder Instructions       General Comments      Pertinent Vitals/ Pain       Pain Assessment: 0-10 Pain Score: 9  Pain Location: headache Pain Descriptors / Indicators: Headache Pain Intervention(s): Limited activity within patient's tolerance;Monitored during session;Repositioned  Home Living  Prior Functioning/Environment              Frequency Min 3X/week     Progress Toward Goals  OT Goals(current goals can now be found in the care plan section)  Progress towards OT goals: Progressing toward goals  ADL Goals Pt Will Perform Grooming: with supervision;standing Pt Will Perform Upper Body Bathing: with set-up;sitting Pt Will Perform Lower Body Bathing: with supervision;sit to/from stand Pt Will Perform Upper Body  Dressing: with set-up;sitting Pt Will Perform Lower Body Dressing: with supervision;sit to/from stand Pt Will Transfer to Toilet: with min guard assist;ambulating;regular height toilet;bedside commode Pt Will Perform Toileting - Clothing Manipulation and hygiene: with supervision;sit to/from stand Additional ADL Goal #1: Pt will be independent with vision HEP  Plan Discharge plan remains appropriate    Co-evaluation                 End of Session     Activity Tolerance Patient tolerated treatment well   Patient Left in bed;with call bell/phone within reach   Nurse Communication Mobility status        Time: [id number] OT Time Calculation (min): 38 min  Charges: OT General Charges $OT Visit: 1 Procedure OT Treatments $Self Care/Home Management : 38-52 mins  Maxwell Carroll M 03/17/2015, 9:47 AM

## 2015-03-17 NOTE — Discharge Summary (Signed)
Date of Admission: March 15, 2015  Date of Discharge: March 17, 2015  Admission Diagnosis: cerebellar cystic mass  Discharge Diagnosis: same  Procedure Performed: suboccipital craniectomy for resection of cerebellar cystic mass  Attending: [facility] Course:  The patient was admitted for the above listed operation.  He tolerated this well.  He had an uneventful postoperative course except for some urinary retention.  He was started on Flomax.  On postop day 2 he was accepted toinpatient rehabilitation and was discharged at that time.  Discharged Medications: resume prior medications.  Flomax 0.4 mg daily.  Wean dexamethasone over one week.  Norco for pain.  Robaxin and Valium for muscle spasms.  Follow up: With me in 2 weeks

## 2015-03-17 NOTE — Care Management Important Message (Signed)
Important Message  Patient Details  Name: Maxwell Carroll MRN: [id number] Date of Birth: Jun 22, 1945   Medicare Important Message Given:  Yes    Nathen May 03/17/2015, 11:54 AM

## 2015-03-17 NOTE — Progress Notes (Signed)
Physical Medicine and Rehabilitation Consult Reason for Consult: Left superior cerebellar cystic mass Referring Physician: Dr. Cyndy Freeze   HPI: Maxwell Carroll is a 70 y.o. right handed male with history of hypertension, bilateral knee replacement, remote tobacco abuse. Patient lives with spouse independent prior to admission. One level home 5 steps to entry. Admitted 03/15/2015 with incidentally discovered posterior fossa cystic mass during workup of back pain in January. Underwent suboccipital craniectomy for tumor resection use of stereotactic navigation 03/15/2015 per Dr. Cyndy Freeze. Hospital course pain management. Currently on a clear liquid diet and slowly advanced. Physical occupational therapy evaluation completed 03/16/2015 with recommendations of physical medicine rehabilitation consult.  Patient remains nauseated but he is no longer vomiting. He denies any double vision currently. He has not tried getting out of bed. He does complain of headache. He denies any persistent hiccups Review of Systems  Constitutional: Negative for fever and chills.  HENT: Negative for hearing loss.   Nonspecific headaches  Eyes: Negative for blurred vision and double vision.  Respiratory: Negative for cough and shortness of breath.  Cardiovascular: Negative for chest pain, palpitations and leg swelling.  Gastrointestinal: Positive for constipation. Negative for nausea and vomiting.  Genitourinary: Negative for dysuria.  Musculoskeletal: Positive for myalgias and joint pain.  Neurological: Negative for weakness.   Numbness and tingling in hands  Psychiatric/Behavioral: Positive for depression.  All other systems reviewed and are negative.  Past Medical History  Diagnosis Date  . Hypercholesteremia   . Cataract   . Arthritis   . Hypertension   . Sleep apnea   . History of bronchitis   . Numbness and tingling in hands   . Herniated disc, cervical   . Lumbar  herniated disc   . History of pneumonia   . Depression   . Seasonal allergies   . [name] (abdominal aortic aneurysm) (HCC)     hx of, but no aneurysm on CT 12/01/14   Past Surgical History  Procedure Laterality Date  . Hammer toe surgery    . Replacement total knee Right   . Replacement total knee bilateral    . Eye surgery      cataracts  . Colonoscopy    . Esophagogastroduodenoscopy     History reviewed. No pertinent family history. Social History:  reports that he quit smoking about 27 years ago. He has never used smokeless tobacco. He reports that he drinks alcohol. He reports that he does not use illicit drugs. Allergies: No Known Allergies Medications Prior to Admission  Medication Sig Dispense Refill  . albuterol (PROVENTIL HFA;VENTOLIN HFA) 108 (90 Base) MCG/ACT inhaler Inhale 2 puffs into the lungs every 4 (four) hours as needed for wheezing or shortness of breath.    Marland Kitchen FLUoxetine (PROZAC) 40 MG capsule Take 40 mg by mouth daily.    Marland Kitchen lisinopril (PRINIVIL,ZESTRIL) 10 MG tablet Take 10 mg by mouth daily.  3  . lovastatin (MEVACOR) 40 MG tablet Take 80 mg by mouth every evening.  3    Home: Home Living Family/patient expects to be discharged to:: Private residence Living Arrangements: Spouse/significant other Available Help at Discharge: Family, Available 24 hours/day Type of Home: House Home Access: Stairs to enter [employer] of Steps: 5 Entrance Stairs-Rails: Right Home Layout: One level Biochemist, clinical: [place]: Environmental consultant - 2 wheels, [employer] - single point, Wheelchair - manual Additional Comments: Wife interested in Geneticist, molecular.  Functional History: Prior Function Level of Independence: Independent Functional Status:  Mobility: Bed Mobility General bed mobility  comments: pt sitting in recliner. Transfers Overall transfer level: Needs assistance Equipment used:  Rolling walker (2 wheeled) Transfers: Sit to/from Stand Sit to Stand: Min assist General transfer comment: cues for UE use and controlling descent to sitting. pt demonstrates good follow through on cueing. pt endorses dizziness in standing with BP stable.       ADL:    Cognition: Cognition Overall Cognitive Status: Within Functional Limits for tasks assessed Orientation Level: Oriented X4 Cognition Arousal/Alertness: Awake/alert Behavior During Therapy: WFL for tasks assessed/performed Overall Cognitive Status: Within Functional Limits for tasks assessed  Blood pressure 130/66, pulse 81, temperature 98.1 F (36.7 C), temperature source Oral, resp. rate 15, height 6\' 1"  (1.854 m), weight 140.4 kg (309 lb 8.4 oz), SpO2 92 %. Physical Exam  Vitals reviewed. Constitutional: He appears well-developed.  HENT:  Craniectomy site clean and dry  Eyes:  Pupils reactive to light  Neck: Normal range of motion. Neck supple. No thyromegaly present.  Cardiovascular: Normal rate and regular rhythm.  Respiratory: Effort normal and breath sounds normal. No respiratory distress.  GI: Soft. Bowel sounds are normal. He exhibits no distension.  Neurological:   Mood is flat but appropriate. Makes good eye contact with examiner. Oriented to person place and date of birth. Follow simple commands.  motor strength is 5/5 bilateral deltoid, biceps, triceps, grip 4 minus at the hip flexor and knee extensor and ankle dorsiflexor bilaterally Sensation intact to light touch bilateral upper and lower limbs He has past pointing on finger-nose-finger testing on the left side, mild dysmetria bilaterally.   Lab Results Last 24 Hours    Results for orders placed or performed during the hospital encounter of 03/15/15 (from the past 24 hour(s))  MRSA PCR Screening Status: None   Collection Time: 03/15/15 8:45 PM  Result Value Ref Range   MRSA by PCR NEGATIVE NEGATIVE  Surgical PCR screen  *Canceled* Status: None ()   Collection Time: 03/15/15 9:30 PM   Narrative   LIS Cancel (ORR/DE = Data Error)  Basic metabolic panel Status: Abnormal   Collection Time: 03/16/15 5:45 AM  Result Value Ref Range   Sodium 141 135 - 145 mmol/L   Potassium 3.9 3.5 - 5.1 mmol/L   Chloride 106 101 - 111 mmol/L   CO2 25 22 - 32 mmol/L   Glucose, Bld 161 (H) 65 - 99 mg/dL   BUN 13 6 - 20 mg/dL   Creatinine, Ser 0.93 0.61 - 1.24 mg/dL   Calcium 8.3 (L) 8.9 - 10.3 mg/dL   GFR calc non Af Amer >60 >60 mL/min   GFR calc Af Amer >60 >60 mL/min   Anion gap 10 5 - 15  CBC Status: Abnormal   Collection Time: 03/16/15 5:45 AM  Result Value Ref Range   WBC 14.1 (H) 4.0 - 10.5 K/uL   RBC 4.31 4.22 - 5.81 MIL/uL   Hemoglobin 12.9 (L) 13.0 - 17.0 g/dL   HCT 39.5 39.0 - 52.0 %   MCV 91.6 78.0 - 100.0 fL   MCH 29.9 26.0 - 34.0 pg   MCHC 32.7 30.0 - 36.0 g/dL   RDW 13.0 11.5 - 15.5 %   Platelets 224 150 - 400 K/uL      Imaging Results (Last 48 hours)    No results found.    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 Does the need for close, 24 hr/day medical supervision in concert with the patient's rehab needs  make it unreasonable for this patient to be served in a less intensive setting? Yes 2. Co-Morbidities requiring supervision/potential complications: history hypertension, status post recent suboccipital craniotomy 3. Due to bladder management, bowel management, safety, skin/wound care, disease management, medication administration, pain management and patient education, does the patient require 24 hr/day rehab nursing? Yes 4. Does the patient require coordinated care of a physician, rehab nurse, PT (1-2 hrs/day, 5 days/week), OT (1-2 hrs/day, 5 days/week) and SLP (0.5-1 hrs/day, 5 days/week) to address  physical and functional deficits in the context of the above medical diagnosis(es)? Yes Addressing deficits in the following areas: balance, endurance, locomotion, strength, transferring, bowel/bladder control, bathing, dressing, feeding, grooming, toileting, cognition, speech, language, swallowing and psychosocial support 5. Can the patient actively participate in an intensive therapy program of at least 3 hrs of therapy per day at least 5 days per week? Yes 6. The potential for patient to make measurable gains while on inpatient rehab is good 7. Anticipated functional outcomes upon discharge from inpatient rehab are supervision with PT, supervision with OT, supervision with SLP. 8. Estimated rehab length of stay to reach the above functional goals is: 14-17 days 9. Does the patient have adequate social supports and living environment to accommodate these discharge functional goals? Yes and Potentially 10. Anticipated D/C setting: Home 11. Anticipated post D/C treatments: [place] therapy 12. Overall Rehab/Functional Prognosis: good  RECOMMENDATIONS: This patient's condition is appropriate for continued rehabilitative care in the following setting: CIR Patient has agreed to participate in recommended program. Yes Note that insurance prior authorization may be required for reimbursement for recommended care.  Comment: May benefit from scopolamine patch for motion sensitivity    03/16/2015       Revision History     Date/Time User Provider Type Action   03/16/2015 5:24 PM Charlett Blake, MD Physician Sign   03/16/2015 1:03 PM Cathlyn Parsons, PA-C Physician Assistant Pend   View Details Report       Routing History     Date/Time From To Method   03/16/2015 5:24 PM Charlett Blake, MD Charlett Blake, MD In Basket   03/16/2015 5:24 PM Charlett Blake, MD Adin Hector, MD Fax

## 2015-03-17 NOTE — Care Management Note (Signed)
Case Management Note  Patient Details  Name: Maxwell Carroll MRN: [id number] Date of Birth: [birth year]  Subjective/Objective:      Insurance authorization received for admission to CIR.             Action/Plan: Plan dc to [facility] later today.    Expected Discharge Date:    03/17/15              Expected Discharge Plan:  [facility]  In-House Referral:     Discharge planning Services  CM Consult  Post Acute Care Choice:    Choice offered to:     DME Arranged:    DME Agency:     HH Arranged:    HH Agency:     Status of Service:  Completed, signed off  Medicare Important Message Given:  Yes Date Medicare IM Given:    Medicare IM give by:    Date Additional Medicare IM Given:    Additional Medicare Important Message give by:     If discussed at [place] of Stay Meetings, dates discussed:    Additional Comments:  Reinaldo Raddle, RN, BSN  Trauma/Neuro ICU Case Manager [phone]

## 2015-03-17 NOTE — Progress Notes (Signed)
Doing well Had some urinary retention and had to have I&O cath AVSS Awake and alert Moving all extremities well Incision c/d/i Some fullness beneath incision Stable/improving Start flomax Decadron bid Hopefully to rehab this afternoon, if not then early next week Continue therapy If he goes to rehab then wean dexamethasone over the next week

## 2015-03-17 NOTE — H&P (Signed)
Physical Medicine and Rehabilitation Admission H&P    Chief complaint: Headache  HPI: Maxwell Carroll is a 70 y.o. right handed male with history of hypertension, bilateral knee replacement, remote tobacco abuse. Patient lives with spouse independent prior to admission. One level home 5 steps to entry. Admitted 03/15/2015 with incidentally discovered posterior fossa cystic mass during workup of back pain in January. Underwent suboccipital craniectomy for tumor resection use of stereotactic navigation 03/15/2015 per Dr. Cyndy Freeze. Hospital course pain management. Currently on a clear liquid diet due to nausea and slowly advanced. Follow-up MRI of the brain 03/16/2015 negative for hydrocephalus or acute infarct. Decadron protocol as directed and taper over the next week. Physical and occupational therapy evaluations completed 03/16/2015 with recommendations of physical medicine rehabilitation consult. Patient was admitted for a comprehensive rehabilitation program.  ROS Constitutional: Negative for fever and chills.  HENT: Negative for hearing loss.   Nonspecific headaches  Eyes: Negative for blurred vision and double vision.  Respiratory: Negative for cough and shortness of breath.  Cardiovascular: Negative for chest pain, palpitations and leg swelling.  Gastrointestinal: Positive for constipation. Negative for nausea and vomiting.  Genitourinary: Negative for dysuria.  Musculoskeletal: Positive for myalgias and joint pain.  Neurological: Negative for weakness.   Numbness and tingling in hands  Psychiatric/Behavioral: Positive for depression.  All other systems reviewed and are negative   Past Medical History  Diagnosis Date  . Hypercholesteremia   . Cataract   . Arthritis   . Hypertension   . Sleep apnea   . History of bronchitis   . Numbness and tingling in hands   . Herniated disc, cervical   . Lumbar herniated disc   . History of pneumonia   . Depression   . Seasonal  allergies   . [name] (abdominal aortic aneurysm) (HCC)     hx of, but no aneurysm on CT 12/01/14   Past Surgical History  Procedure Laterality Date  . Hammer toe surgery    . Replacement total knee Right   . Replacement total knee bilateral    . Eye surgery      cataracts  . Colonoscopy    . Esophagogastroduodenoscopy    . Craniotomy N/A 03/15/2015    Procedure: Suboccipital craniectomy for tumor resection;  Surgeon: Kevan Ny Ditty, MD;  Location: [facility];  Service: Neurosurgery;  Laterality: N/A;  Suboccipital craniectomy for tumor resection  . Application of cranial navigation  03/15/2015    Procedure: APPLICATION OF CRANIAL NAVIGATION;  Surgeon: Kevan Ny Ditty, MD;  Location: [facility];  Service: Neurosurgery;;  APPLICATION OF CRANIAL NAVIGATION   History reviewed. No pertinent family history. Social History:  reports that he quit smoking about 27 years ago. He has never used smokeless tobacco. He reports that he drinks alcohol. He reports that he does not use illicit drugs. Allergies: No Known Allergies Medications Prior to Admission  Medication Sig Dispense Refill  . albuterol (PROVENTIL HFA;VENTOLIN HFA) 108 (90 Base) MCG/ACT inhaler Inhale 2 puffs into the lungs every 4 (four) hours as needed for wheezing or shortness of breath.    Marland Kitchen FLUoxetine (PROZAC) 40 MG capsule Take 40 mg by mouth daily.    Marland Kitchen lisinopril (PRINIVIL,ZESTRIL) 10 MG tablet Take 10 mg by mouth daily.  3  . lovastatin (MEVACOR) 40 MG tablet Take 80 mg by mouth every evening.  3    Home: Home Living Family/patient expects to be discharged to:: Private residence Living Arrangements: Spouse/significant other Available Help at  Discharge: Family, Available 24 hours/day Type of Home: House Home Access: Stairs to enter [employer] of Steps: 5 Entrance Stairs-Rails: Right Home Layout: One level Bathroom Toilet: Standard Home Equipment: Environmental consultant - 2 wheels, [employer] - single point, Wheelchair -  manual Additional Comments: Wife interested in Geneticist, molecular.   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 Transfers Overall transfer level: Needs assistance Equipment used: Rolling walker (2 wheeled) Transfers: Sit to/from Stand Sit to Stand: Min assist General transfer comment: Pt deferred due to headache       ADL: ADL Overall ADL's : Needs assistance/impaired Eating/Feeding: Set up, Bed level Grooming: Wash/dry hands, Wash/dry face, Oral care, Brushing hair, Minimal assistance, Bed level Upper Body Bathing: Moderate assistance, Bed level Lower Body Bathing: Total assistance, Bed level Upper Body Dressing : Total assistance, Bed level Lower Body Dressing: Total assistance, Bed level Toilet Transfer: Total assistance Toilet Transfer Details (indicate cue type and reason): Pt unable to tolerate at this time due to headache  Toileting- Clothing Manipulation and Hygiene: Total assistance, Sit to/from stand General ADL Comments: Activity limited due to headache this pm   Cognition: Cognition Overall Cognitive Status: Within Functional Limits for tasks assessed Orientation Level: Oriented X4 Cognition Arousal/Alertness: Awake/alert Behavior During Therapy: WFL for tasks assessed/performed Overall Cognitive Status: Within Functional Limits for tasks assessed  Physical Exam: Blood pressure 137/66, pulse 77, temperature 99.2 F (37.3 C), temperature source Oral, resp. rate 20, height '6\' 1"'$  (1.854 m), weight 140.4 kg (309 lb 8.4 oz), SpO2 90 %. Physical Exam Constitutional: He appears well-developed. Appears fatigued  HENT:  Craniectomy site clean and dry  Eyes:  Pupils reactive to  light  Neck: Normal range of motion. Neck supple. No thyromegaly present.  Cardiovascular: Normal rate and regular rhythm. no murmur Respiratory: Effort normal and breath sounds normal. No respiratory distress.  GI: Soft. Bowel sounds are normal. He exhibits no distension.  Neurological: Makes good eye contact with examiner. Oriented to person place and date of birth. Follow simple commands.  motor strength is 5/5 bilateral deltoid, biceps, triceps, grip 4 minus at the hip flexor and knee extensor and ankle dorsiflexor bilaterally Sensation intact to light touch bilateral upper and lower limbs Diplopia with confrontation, tracks to all fields, dysmetria, left upper and left lower ataxic with general movements. Skin: other than surgical incision is intact Psych: subdued  Results for orders placed or performed during the hospital encounter of 03/15/15 (from the past 48 hour(s))  MRSA PCR Screening     Status: None   Collection Time: 03/15/15  8:45 PM  Result Value Ref Range   MRSA by PCR NEGATIVE NEGATIVE    Comment:        The GeneXpert MRSA Assay (FDA approved for NASAL specimens only), is one component of a comprehensive MRSA colonization surveillance program. It is not intended to diagnose MRSA infection nor to guide or monitor treatment for MRSA infections.   Basic metabolic panel     Status: Abnormal   Collection Time: 03/16/15  5:45 AM  Result Value Ref Range   Sodium 141 135 - 145 mmol/L   Potassium 3.9 3.5 - 5.1 mmol/L   Chloride 106 101 - 111 mmol/L   CO2 25 22 - 32 mmol/L  Glucose, Bld 161 (H) 65 - 99 mg/dL   BUN 13 6 - 20 mg/dL   Creatinine, Ser 5.91 0.61 - 1.24 mg/dL   Calcium 8.3 (L) 8.9 - 10.3 mg/dL   GFR calc non Af Amer >60 >60 mL/min   GFR calc Af Amer >60 >60 mL/min    Comment: (NOTE) The eGFR has been calculated using the CKD EPI equation. This calculation has not been validated in all clinical situations. eGFR's persistently <60 mL/min signify  possible Chronic Kidney Disease.    Anion gap 10 5 - 15  CBC     Status: Abnormal   Collection Time: 03/16/15  5:45 AM  Result Value Ref Range   WBC 14.1 (H) 4.0 - 10.5 K/uL   RBC 4.31 4.22 - 5.81 MIL/uL   Hemoglobin 12.9 (L) 13.0 - 17.0 g/dL   HCT 97.9 43.9 - 12.9 %   MCV 91.6 78.0 - 100.0 fL   MCH 29.9 26.0 - 34.0 pg   MCHC 32.7 30.0 - 36.0 g/dL   RDW 90.2 05.7 - 93.4 %   Platelets 224 150 - 400 K/uL   Mr Laqueta Jean Wo Contrast  03/16/2015  CLINICAL DATA:  Cerebellar tumor resection 03/15/2015 EXAM: MRI HEAD WITHOUT AND WITH CONTRAST TECHNIQUE: Multiplanar, multiecho pulse sequences of the brain and surrounding structures were obtained without and with intravenous contrast. CONTRAST:  54mL MULTIHANCE GADOBENATE DIMEGLUMINE 529 MG/ML IV SOLN COMPARISON:  MRI 02/22/2015 FINDINGS: Postop day 1 suboccipital craniotomy for cerebellar cyst and tumor resection. Suboccipital fluid collection measures 5 x 7 cm and likely is CSF. Negative for hydrocephalus. No herniation of the cerebellum into the spinal canal. Interval resection of cyst and enhancing nodule in the superior cerebellar vermis. There remains fluid and gas in this space in the midline. The small enhancing nodule is no longer visualized. There is intrinsic T1 shortening in the cerebellar vermis inferiorly compatible with methemoglobin and blood products. There is mild enhancement of the tentorium bilaterally as well as the posterior fossa dura compatible with small subdural hemorrhage and postop enhancement. Negative for acute infarct. Mild chronic microvascular ischemic change in the cerebral white matter. Mucosal edema in the paranasal sinuses. IMPRESSION: Interval resection of superior cerebellar cyst and enhancing nodule. No residual enhancing nodule seen. Postop fluid collection in the surgical cavity contains CSF and blood and measures approximately 2 x 3 cm. There is a small amount of methemoglobin in the cerebellar vermis below the  surgical resection cavity. Suboccipital fluid collection related to recent surgery and CSF measures 5 x 7cm. Negative for hydrocephalus.  No acute infarct. Electronically Signed   By: Marlan Palau M.D.   On: 03/16/2015 13:30       Medical Problem List and Plan: 1.  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   -Taper Decadron over the next 1 week  -path still pending   -symptom mgt, prn meclizine for n/v/dizziness 2.  DVT Prophylaxis/Anticoagulation: SCDs. Monitor for any signs of DVT.   -mobilize 3. Pain Management: Hydrocodone and Robaxin as needed. Monitor with increased mobility 4. Mood: Prozac 40 mg daily. Team/family to provide emotional support 5. Neuropsych: This patient is capable of making decisions on his own behalf. 6. Skin/Wound Care: Routine skin checks. Continue staples fornow 7. Fluids/Electrolytes/Nutrition: Routine I&O's with follow-up chemistries as part of admission 8. Hypertension. Lisinopril 10 mg daily. Monitor with increased mobility 9. Hyperlipidemia. Pravachol 10.BPH.Flomax 0.4 mg daily.   Post Admission Physician Evaluation: 1. Functional  deficits secondary  to left cerebellar mass s/p resection. 2. Patient is admitted to receive collaborative, interdisciplinary care between the physiatrist, rehab nursing staff, and therapy team. 3. Patient's level of medical complexity and substantial therapy needs in context of that medical necessity cannot be provided at a lesser intensity of care such as a SNF. 4. Patient has experienced substantial functional loss from his/her baseline which was documented above under the "Functional History" and "Functional Status" headings.  Judging by the patient's diagnosis, physical exam, and functional history, the patient has potential for functional progress which will result in measurable gains while on inpatient rehab.  These gains will be of substantial and practical use upon  discharge  in facilitating mobility and self-care at the household level. 5. Physiatrist will provide 24 hour management of medical needs as well as oversight of the therapy plan/treatment and provide guidance as appropriate regarding the interaction of the two. 6. 24 hour rehab nursing will assist with bladder management, bowel management, safety, skin/wound care, disease management, medication administration, pain management and patient education  and help integrate therapy concepts, techniques,education, etc. 7. PT will assess and treat for/with: Lower extremity strength, range of motion, stamina, balance, functional mobility, safety, adaptive techniques and equipment, NMR, vestibular assess and treat, ego support, pain mgt, symptom mgt.   Goals are: mod i to supervision. 8. OT will assess and treat for/with: ADL's, functional mobility, safety, upper extremity strength, adaptive techniques and equipment, NMR, vestibular assess and treat, pain control, community reintegration.   Goals are: supervision to mod I. Therapy may proceed with showering this patient. 9. SLP will assess and treat for/with: n/z.  Goals are: n/z. 10. Case Management and Social Worker will assess and treat for psychological issues and discharge planning. 11. Team conference will be held weekly to assess progress toward goals and to determine barriers to discharge. 12. Patient will receive at least 3 hours of therapy per day at least 5 days per week. 13. ELOS: 8-12 days       14. Prognosis:  good     Meredith Staggers, MD, [facility] 03/17/2015   03/17/2015

## 2015-03-17 NOTE — Progress Notes (Signed)
Patient unable to void after foley removed and no urge. I/O cath @ 0130  900cc yellow urine .

## 2015-03-17 NOTE — Progress Notes (Signed)
I have insurance approval and will admit pt to inpt rehab today. I have met with pt and his wife at bedside and they are in agreement. I will make arrangements to admit today. [phone]

## 2015-03-17 NOTE — Progress Notes (Signed)
I met with pt at bedside to discuss a possible inpt rehab admission pending insurance approval. I have discussed with PT, OT and RN. I contacted Dr. Cyndy Freeze to discuss and will begin insurance authorization for a possible admit later today or tomorrow. I will contact pt's wife to also discuss. [phone]

## 2015-03-17 NOTE — PMR Pre-admission (Signed)
PMR Admission Coordinator Pre-Admission Assessment  Patient: Maxwell Carroll is an 70 y.o., male MRN: [id number] DOB: August 26, 1945 Height: 6\' 1"  (185.4 cm) Weight: (!) 140.4 kg (309 lb 8.4 oz)              Insurance Information HMO:     PPO: yes     PCP:      IPA:      80/20:      OTHER: medicare replacement policy PRIMARY: [place]      Policy#: [id number]      Subscriber: pt CM Name: Jenny Reichmann      Phone#: [phone]     Fax#: [id number] Pre-Cert#: [id number] approved for 7 days. F/u CM Earney Hamburg phone [phone] fax EMR access      Employer: retired Benefits:  Phone #: [phone]     Name: 03/17/15 Eff. Date: 01/29/15     Deduct: none      Out of Pocket Max: $4000      Life Max: none CIR: $160 per day copay days 1-10 then covers 100%      SNF: no copay days 1-20; $50 copay per days 21-100 Outpatient: $20 copay per visit     Co-Pay: no visit limit Home Health: 100%      Co-Pay: no visit limit DME: 80%     Co-Pay: 20% Providers: in network  SECONDARY: none       Medicaid Application Date:       Case Manager:  Disability Application Date:       Case Worker:   Emergency Facilities manager Information    Name Relation Home Work [place] Spouse [place] Daughter [phone]       Current Medical History  Patient Admitting Diagnosis: 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 right handed male with history of hypertension, bilateral knee replacement, remote tobacco abuse. Patient lives with spouse independent prior to admission. One level home 5 steps to entry. Admitted 03/15/2015 with incidentally discovered posterior fossa cystic mass during workup of back pain in January. Underwent suboccipital craniectomy for tumor resection use of stereotactic navigation 03/15/2015 per Dr. Cyndy Freeze. Hospital course pain management. Currently on a  clear liquid diet due to nausea and slowly advanced. Follow-up MRI of the brain 03/16/2015 negative for hydrocephalus or acute infarct. Decadron protocol as directed and taper over the next week.  Difficulty with urinary retention and began flomax.   Past Medical History  Past Medical History  Diagnosis Date  . Hypercholesteremia   . Cataract   . Arthritis   . Hypertension   . Sleep apnea   . History of bronchitis   . Numbness and tingling in hands   . Herniated disc, cervical   . Lumbar herniated disc   . History of pneumonia   . Depression   . Seasonal allergies   . [name] (abdominal aortic aneurysm) (HCC)     hx of, but no aneurysm on CT 12/01/14    Family History  family history is not on file.  Prior Rehab/Hospitalizations:  Has the patient had major surgery during 100 days prior to admission? No  Current Medications   Current facility-administered medications:  .  0.9 %  sodium chloride infusion, , Intravenous, Continuous, Kevan Ny Ditty, MD, Last Rate: 100 mL/hr at 03/17/15 0900 .  albuterol (PROVENTIL) (2.5 MG/3ML) 0.083% nebulizer solution  3 mL, 3 mL, Inhalation, Q4H PRN, Kevan Ny Ditty, MD .  bisacodyl (DULCOLAX) EC tablet 5 mg, 5 mg, Oral, Daily PRN, Kevan Ny Ditty, MD .  dexamethasone (DECADRON) tablet 4 mg, 4 mg, Oral, Q12H, Kevan Ny Ditty, MD, 4 mg at 03/17/15 1014 .  diazepam (VALIUM) tablet 5 mg, 5 mg, Oral, Q8H PRN, Kevan Ny Ditty, MD, 5 mg at 03/17/15 0727 .  docusate sodium (COLACE) capsule 100 mg, 100 mg, Oral, BID, Kevan Ny Ditty, MD, 100 mg at 03/17/15 1058 .  FLUoxetine (PROZAC) capsule 40 mg, 40 mg, Oral, Daily, Kevan Ny Ditty, MD, 40 mg at 03/17/15 1014 .  hydrALAZINE (APRESOLINE) injection 5-10 mg, 5-10 mg, Intravenous, Q1H PRN, Kevan Ny Ditty, MD, 10 mg at 03/15/15 2305 .  HYDROcodone-acetaminophen (NORCO/VICODIN) 5-325 MG per tablet 1 tablet, 1 tablet, Oral, Q4H PRN, Kevan Ny Ditty, MD, 1 tablet at  03/17/15 1301 .  HYDROmorphone (DILAUDID) injection 0.5-1 mg, 0.5-1 mg, Intravenous, Q2H PRN, Kevan Ny Ditty, MD, 1 mg at 03/17/15 1408 .  lisinopril (PRINIVIL,ZESTRIL) tablet 10 mg, 10 mg, Oral, Daily, Kevan Ny Ditty, MD, 10 mg at 03/17/15 1014 .  methocarbamol (ROBAXIN) tablet 750 mg, 750 mg, Oral, Q6H, Kevan Ny Ditty, MD, 750 mg at 03/17/15 1057 .  naloxone [facility]) injection 0.08 mg, 0.08 mg, Intravenous, PRN, Kevan Ny Ditty, MD .  nicardipine (CARDENE) 20mg  in 0.86% saline 241ml IV infusion (0.1 mg/ml), 3-15 mg/hr, Intravenous, Continuous, Kevan Ny Ditty, MD .  ondansetron [facility]) tablet 4 mg, 4 mg, Oral, Q4H PRN **OR** ondansetron (ZOFRAN) injection 4 mg, 4 mg, Intravenous, Q4H PRN, Kevan Ny Ditty, MD, 4 mg at 03/16/15 0813 .  pantoprazole (PROTONIX) injection 40 mg, 40 mg, Intravenous, QHS, Kevan Ny Ditty, MD, 40 mg at 03/16/15 2147 .  pravastatin (PRAVACHOL) tablet 10 mg, 10 mg, Oral, q1800, Kevan Ny Ditty, MD, 10 mg at 03/16/15 1643 .  promethazine (PHENERGAN) tablet 12.5-25 mg, 12.5-25 mg, Oral, Q4H PRN, Kevan Ny Ditty, MD .  senna [facility]) tablet 8.6 mg, 1 tablet, Oral, BID, Kevan Ny Ditty, MD, 8.6 mg at 03/17/15 1014 .  sodium phosphate (FLEET) 7-19 GM/118ML enema 1 enema, 1 enema, Rectal, Once PRN, Kevan Ny Ditty, MD .  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  Independent  Indoor Mobility: Did the patient need assistance with walking from room to room (with or without device)? Independent  Stairs: Did the patient need assistance with internal or external stairs (with or without device)?  Independent  Functional Cognition: Did the patient need help planning regular tasks such as shopping or remembering to take medications? Independent  Current Functional Level Cognition  Overall Cognitive Status: Within Functional Limits for tasks assessed Orientation Level: Oriented X4    Extremity Assessment (includes Sensation/Coordination)  Upper Extremity Assessment: Generalized weakness, RUE deficits/detail, LUE deficits/detail RUE Deficits / Details: dysmetria noted  RUE Coordination: decreased gross motor LUE Deficits / Details: dysmetria noted  LUE Coordination: decreased gross motor  Lower Extremity Assessment: Defer to PT evaluation    ADLs  Overall ADL's : Needs assistance/impaired Eating/Feeding: Set up, Sitting Grooming: Wash/dry hands, Wash/dry face, Minimal assistance, Standing Upper Body Bathing: Minimal assitance, Sitting Lower Body Bathing: Moderate assistance, Sit to/from stand Upper Body Dressing : Moderate assistance, Sitting Lower Body Dressing: Moderate assistance, Sit to/from stand Toilet Transfer: Minimal assistance, Ambulation, Comfort height toilet, BSC, RW Toilet Transfer Details  (indicate cue type and reason): Pt unable to tolerate at this time due to headache  Toileting- Clothing Manipulation and Hygiene: Minimal assistance, Sit to/from stand Functional mobility during ADLs: Minimal assistance, Moderate assistance General ADL Comments: Pt very motivated.  He moves slowly due to pain and dizziness     Mobility  Overal bed mobility: Needs Assistance Bed Mobility: Supine to Sit, Sit to Supine Rolling: Min assist Supine to sit: Min assist Sit to supine: Min assist General bed mobility comments: A with bringing trunk up to sitting and for LEs to return to supine.      Transfers  Overall transfer level: Needs assistance Equipment used: Rolling walker (2 wheeled) Transfers: Sit to/from Stand Sit to Stand: Min assist Stand pivot transfers: Min assist General transfer comment: cues for UE use and controlling descent to sitting.      Ambulation / Gait / Stairs / Wheelchair Mobility  Ambulation/Gait Ambulation/Gait assistance: Min assist, +2 safety/equipment Ambulation Distance (Feet): 25 Feet Assistive device: Rolling walker (2 wheeled) Gait Pattern/deviations: Step-through pattern, Decreased stride length, Wide base of support General Gait Details: pt moves slowly and very labored.  pt relies heavily on RW and needs cues for more upright posture.      Posture / Balance Balance Overall balance assessment: Needs assistance Sitting-balance support: No upper extremity supported, Feet supported Sitting balance-Leahy Scale: Fair Standing balance support: Bilateral upper extremity supported, During functional activity Standing balance-Leahy Scale: Poor Standing balance comment: requires min A    Special needs/care consideration BiPAP/CPAP yes Skin  Surgical incision Bowel mgmt: no BM since admit Bladder mgmt: urinary retention/flomax. Diabetic mgmt no   Previous Home Environment Living Arrangements: Spouse/significant other Available Help at Discharge: Family,  Available 24 hours/day Type of Home: House Home Layout: One level Home Access: Stairs to enter Entrance Stairs-Rails: Right Entrance Stairs-Number of Steps: 5 Bathroom Shower/Tub: Tub/shower unit, Architectural technologist: Standard Bathroom Accessibility: Yes How Accessible: Accessible via walker Home Care Services: No Additional Comments: Wife interested in Geneticist, molecular.  Discharge Living Setting Plans for Discharge Living Setting: Patient's home, Lives with (comment) (wife) Type of Home at Discharge: House Discharge Home Layout: One level Discharge Home Access: Stairs to enter Entrance Stairs-Rails: Right Entrance Stairs-Number of Steps: 5 Discharge Bathroom Shower/Tub: Tub/shower unit, Curtain Discharge Bathroom Accessibility: Yes How Accessible: Accessible via walker Does the patient have any problems obtaining your medications?: No  Social/Family/Support Systems Patient Roles: Parent, Spouse, Other (Comment) (works Education administrator houses, retired Holiday representative office) Contact Information: Ahmari Hoadley, wife Anticipated Caregiver: wife Anticipated Caregiver's Contact Information: see above Ability/Limitations of Caregiver: no physical  limitations Caregiver Availability: 24/7 Discharge Plan Discussed with Primary Caregiver: Yes Is Caregiver In Agreement with Plan?: Yes Does Caregiver/Family have Issues with Lodging/Transportation while Pt is in Rehab?: No Wife has many concerns financially for he is retired and 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  Goals/Additional Needs Patient/Family Goal for Rehab: supervision with PT, OT, and SLP Expected length of stay: ELOS 14-17 days Pt/Family Agrees to Admission and willing to participate: Yes Program Orientation Provided & Reviewed with Pt/Caregiver Including Roles  & Responsibilities: Yes  Decrease burden of Care through IP rehab admission: n/a  Possible need for SNF placement upon discharge: not  anticipated  Patient Condition: This patient's condition remains as documented in the consult dated 03/17/2015, in which the Rehabilitation Physician determined and documented that the patient's condition is appropriate for intensive rehabilitative care in an inpatient rehabilitation facility. Will admit to inpatient rehab today.  Preadmission Screen Completed By:  Cleatrice Burke, 03/17/2015 2:30 PM ______________________________________________________________________   Discussed status with Dr. Naaman Plummer on 03/17/2015 at  1430 and received telephone approval for admission today.  Admission Coordinator:  Cleatrice Burke, time [id number] Date 03/16/2105.

## 2015-03-18 ENCOUNTER — Inpatient Hospital Stay (HOSPITAL_COMMUNITY): Payer: Medicare Other | Admitting: Speech Pathology

## 2015-03-18 ENCOUNTER — Inpatient Hospital Stay (HOSPITAL_COMMUNITY): Payer: Self-pay | Admitting: Physical Therapy

## 2015-03-18 ENCOUNTER — Inpatient Hospital Stay (HOSPITAL_COMMUNITY): Payer: Medicare Other | Admitting: Occupational Therapy

## 2015-03-18 DIAGNOSIS — G9389 Other specified disorders of brain: Secondary | ICD-10-CM

## 2015-03-18 DIAGNOSIS — R270 Ataxia, unspecified: Secondary | ICD-10-CM

## 2015-03-18 MED ORDER — PANTOPRAZOLE SODIUM 40 MG PO TBEC
40.0000 mg | DELAYED_RELEASE_TABLET | Freq: Every day | ORAL | Status: DC
  Administered 2015-03-18 – 2015-03-23 (×6): 40 mg via ORAL
  Filled 2015-03-18 (×6): qty 1

## 2015-03-18 MED ORDER — PRO-STAT SUGAR FREE PO LIQD
30.0000 mL | Freq: Two times a day (BID) | ORAL | Status: DC
  Administered 2015-03-18 – 2015-03-22 (×9): 30 mL via ORAL
  Filled 2015-03-18 (×9): qty 30

## 2015-03-18 MED ORDER — OXYCODONE HCL 5 MG PO TABS
5.0000 mg | ORAL_TABLET | ORAL | Status: DC | PRN
  Administered 2015-03-18 – 2015-03-20 (×4): 10 mg via ORAL
  Administered 2015-03-20: 5 mg via ORAL
  Administered 2015-03-21 – 2015-03-24 (×14): 10 mg via ORAL
  Filled 2015-03-18 (×19): qty 2

## 2015-03-18 MED ORDER — TOPIRAMATE 25 MG PO TABS
25.0000 mg | ORAL_TABLET | Freq: Every day | ORAL | Status: DC
  Administered 2015-03-18 – 2015-03-23 (×6): 25 mg via ORAL
  Filled 2015-03-18 (×6): qty 1

## 2015-03-18 MED ORDER — TOPIRAMATE 25 MG PO TABS
25.0000 mg | ORAL_TABLET | ORAL | Status: AC
  Administered 2015-03-18: 25 mg via ORAL
  Filled 2015-03-18: qty 1

## 2015-03-18 MED ORDER — ENSURE ENLIVE PO LIQD
237.0000 mL | Freq: Two times a day (BID) | ORAL | Status: DC
  Administered 2015-03-18 – 2015-03-24 (×10): 237 mL via ORAL

## 2015-03-18 MED ORDER — HYDROMORPHONE HCL 2 MG PO TABS
4.0000 mg | ORAL_TABLET | ORAL | Status: DC | PRN
  Administered 2015-03-18 – 2015-03-20 (×8): 4 mg via ORAL
  Filled 2015-03-18 (×8): qty 2

## 2015-03-18 NOTE — Progress Notes (Signed)
Pt still reporting 9/10 pain in head after available PRN meds given. Also reporting visual abnormalities: "floaters" and a "red curtain" that comes and goes randomly during the day lasting a few seconds at a time. Contacted MD about both complaints. Orders given for PRN Dilauded for severe pain.

## 2015-03-18 NOTE — Evaluation (Addendum)
Occupational Therapy Assessment and Plan  Patient Details  Name: Maxwell Carroll MRN: [id number] Date of Birth: 1945-06-28  OT Diagnosis: acute pain, disturbance of vision and muscle weakness (generalized) Rehab Potential: Rehab Potential (ACUTE ONLY): Excellent ELOS: 10-12 days   Today's Date: 03/18/2015 OT Individual Time:  -   1100-1200   (60 min)       Problem List:  Patient Active Problem List   Diagnosis Date Noted  . Ataxia 03/17/2015  . Cerebellar mass 03/15/2015    Past Medical History:  Past Medical History  Diagnosis Date  . Hypercholesteremia   . Cataract   . Arthritis   . Hypertension   . Sleep apnea   . History of bronchitis   . Numbness and tingling in hands   . Herniated disc, cervical   . Lumbar herniated disc   . History of pneumonia   . Depression   . Seasonal allergies   . [name] (abdominal aortic aneurysm) (HCC)     hx of, but no aneurysm on CT 12/01/14   Past Surgical History:  Past Surgical History  Procedure Laterality Date  . Hammer toe surgery    . Replacement total knee Right   . Replacement total knee bilateral    . Eye surgery      cataracts  . Colonoscopy    . Esophagogastroduodenoscopy    . Craniotomy N/A 03/15/2015    Procedure: Suboccipital craniectomy for tumor resection;  Surgeon: Kevan Ny Ditty, MD;  Location: [facility];  Service: Neurosurgery;  Laterality: N/A;  Suboccipital craniectomy for tumor resection  . Application of cranial navigation  03/15/2015    Procedure: APPLICATION OF CRANIAL NAVIGATION;  Surgeon: Kevan Ny Ditty, MD;  Location: [facility];  Service: Neurosurgery;;  APPLICATION OF CRANIAL NAVIGATION    Assessment & Plan Clinical Impression:Maxwell Carroll is a 70 y.o. right handed male with history of hypertension, bilateral knee replacement, remote tobacco abuse. Patient lives with spouse independent prior to admission. One level home 5 steps to entry. Admitted 03/15/2015 with incidentally discovered  posterior fossa cystic mass during workup of back pain in January. Underwent suboccipital craniectomy for tumor resection use of stereotactic navigation 03/15/2015 per Dr. Cyndy Freeze. Hospital course pain management. Currently on a clear liquid diet due to nausea and slowly advanced. Follow-up MRI of the brain 03/16/2015 negative for hydrocephalus or acute infarct. Decadron protocol as directed and taper over the next week. Physical  Patient transferred to CIR on 03/17/2015 .    Patient currently requires min with basic self-care skills secondary to muscle weakness, decreased visual perceptual skills and peripheral.  Prior to hospitalization, patient could complete BADL and IADL with independent .  Patient will benefit from skilled intervention to increase independence with basic self-care skills and increase level of independence with iADL prior to discharge home with care partner.  Anticipate patient will require intermittent supervision and follow up home health.  OT - End of Session Activity Tolerance: Tolerates < 10 min activity, no significant change in vital signs Endurance Deficit: Yes OT Assessment Rehab Potential (ACUTE ONLY): Excellent Barriers to Discharge:  (none) OT Patient demonstrates impairments in the following area(s): Endurance;Pain;Vision;Balance OT Basic ADL's Functional Problem(s): Grooming;Bathing;Dressing;Toileting OT Advanced ADL's Functional Problem(s): Simple Meal Preparation;Laundry;Light Housekeeping OT Transfers Functional Problem(s): Toilet;Tub/Shower OT Additional Impairment(s): None OT Plan OT Intensity: Minimum of 1-2 x/day, 45 to 90 minutes OT Frequency: 5 out of 7 days OT Duration/Estimated Length of Stay: 10-12days OT Treatment/Interventions: Balance/vestibular training;DME/adaptive equipment instruction;Functional mobility training;Pain  management;Patient/family education;Psychosocial support;Self Care/advanced ADL retraining;Therapeutic Activities;Therapeutic  Exercise;UE/LE Strength taining/ROM;UE/LE Coordination activities OT Self Feeding Anticipated Outcome(s): endependent OT Basic Self-Care Anticipated Outcome(s): independent  OT Toileting Anticipated Outcome(s): independent OT Bathroom Transfers Anticipated Outcome(s): Mod I OT Recommendation Patient destination: Home Follow Up Recommendations: Home health OT Equipment Recommended: Tub/shower bench   Skilled Therapeutic Intervention Pt. Lying in bed with head ache pain 10/10.  Pt did not have clothes and did not want to bathe.  Pt performed bed mobility and Lying to sitting with SBA plus bed rails.  Transferred to recliner with stand pivot with min assist.  Pt active PTA.  OT went over ELOS and POC.  Pt agreed .  Lunch arrived.  Pt opened containers with no assist.  OT Evaluation Precautions/Restrictions  Precautions Precautions: Fall Restrictions Weight Bearing Restrictions: No General   Vital Signs Therapy Vitals BP: (!) 168/82 mmHg Oxygen Therapy SpO2: 96 % O2 Device: Not Delivered Pain Pain Assessment Pain Assessment: Faces Faces Pain Scale: Hurts whole lot Pain Type: Acute pain Pain Location: Head Pain Orientation: Anterior;Posterior Pain Descriptors / Indicators: Headache Pain Frequency: Constant Pain Onset: On-going Pain Intervention(s): RN made aware Home Living/Prior Functioning Home Living Family/patient expects to be discharged to:: Private residence Living Arrangements: Spouse/significant other Available Help at Discharge: Family, Available 24 hours/day Type of Home: House Home Access: Stairs to enter [employer] of Steps: 4   Entrance Stairs-Rails: Left, Right, Can reach both Home Layout: One level Bathroom Shower/Tub: Curtain, Other (comment), Tub/shower unit (GRAB BAR) Bathroom Toilet: Handicapped height Bathroom Accessibility: Yes Additional Comments:  (pt says the toilet is handicapped)  Lives With: Spouse IADL History Homemaking  Responsibilities: Yes Meal Prep Responsibility: Primary Laundry Responsibility: Primary Cleaning Responsibility: Primary Bill Paying/Finance Responsibility: Secondary Shopping Responsibility: Primary Current License: No Mode of Transportation: Public relations account executive: finished high school  Occupation: Retired Audiological scientist for food service) Leisure and Hobbies: read, cross word puzzles Prior Function Level of Independence: Independent with basic ADLs, Independent with homemaking with wheelchair, Independent with gait  Able to Take Stairs?: Reciprically Vocation: Other (comment) (Retired Designer, industrial/product, 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 ) Leisure:  (gardens) ADL   Vision/Perception  Vision- History Baseline Vision/History:  (does not wear glasses) Patient Visual Report: Diplopia;Peripheral vision impairment Perception Perception: Impaired  Cognition Overall Cognitive Status: Difficult to assess Arousal/Alertness: Awake/alert Year: 2017 Month: February Day of Week: Correct Memory: Appears intact Immediate Memory Recall: Sock;Blue;Bed Memory Recall: Sock;Blue;Bed Memory Recall Sock: Without Cue Memory Recall Blue: Without Cue Memory Recall Bed: Without Cue Attention: Selective Selective Attention: Appears intact Awareness: Appears intact Problem Solving: Impaired Problem Solving Impairment: Functional complex Behaviors: Other (comment) (anxious) Safety/Judgment: Appears intact Sensation Sensation Light Touch: Impaired Detail (increased numbness) Coordination Gross Motor Movements are Fluid and Coordinated: Yes Fine Motor Movements are Fluid and Coordinated: No Finger Nose Finger Test: 7.5.x.20 sec  left; 10.5x/20 sec Right (double visiion factored into test) Motor  Motor Motor: Motor impersistence Mobility  Bed Mobility Bed Mobility: Supine to Sit;Rolling Left;Rolling Right Rolling Right: 7: Independent;6: Modified independent (Device/Increase time) Rolling  Left: 6: Modified independent (Device/Increase time) Supine to Sit: 6: Modified independent (Device/Increase time)  Trunk/Postural Assessment  Cervical Assessment Cervical Assessment: Within Functional Limits Thoracic Assessment Thoracic Assessment: Within Functional Limits Lumbar Assessment Lumbar Assessment: Within Functional Limits  Balance Balance Balance Assessed: Yes Dynamic Standing Balance Dynamic Standing - Balance Support: During functional activity;Right upper extremity supported Extremity/Trunk Assessment RUE Assessment RUE Assessment: Within Functional Limits LUE Assessment LUE Assessment: Exceptions to  WFL LUE Strength LUE Overall Strength: Deficits   See Function Navigator for Current Functional Status.   Refer to Care Plan for Long Term Goals  Recommendations for other services: Neuropsych  Discharge Criteria: Patient will be discharged from OT if patient refuses treatment 3 consecutive times without medical reason, if treatment goals not met, if there is a change in medical status, if patient makes no progress towards goals or if patient is discharged from hospital.  The above assessment, treatment plan, treatment alternatives and goals were discussed and mutually agreed upon: by patient  Lisa Roca 03/18/2015, 12:51 PM

## 2015-03-18 NOTE — Progress Notes (Signed)
Initial Nutrition Assessment  DOCUMENTATION CODES:   Morbid obesity  INTERVENTION:  - Will order Ensure Enlive po BID, each supplement provides 350 kcal and 20 grams of protein - Will order 30 mL Prostat BID, each supplement provides 100 kcal and 15 grams of protein. - RD will continue to monitor for needs  NUTRITION DIAGNOSIS:   Inadequate oral intake related to poor appetite as evidenced by per patient/family report.  GOAL:   Patient will meet greater than or equal to 90% of their needs  MONITOR:   PO intake, Supplement acceptance, Weight trends, Labs, Skin, I & O's  REASON FOR ASSESSMENT:   Malnutrition Screening Tool  ASSESSMENT:   70 y.o. right handed male with history of hypertension, bilateral knee replacement, remote tobacco abuse. Patient lives with spouse independent prior to admission. One level home 5 steps to entry. Admitted 03/15/2015 with incidentally discovered posterior fossa cystic mass during workup of back pain in January. Underwent suboccipital craniectomy for tumor resection use of stereotactic navigation 03/15/2015 per Dr. Cyndy Freeze. Hospital course pain management. Currently on a clear liquid diet due to nausea and slowly advanced. Follow-up MRI of the brain 03/16/2015 negative for hydrocephalus or acute infarct. Decadron protocol as directed and taper over the next week. Physical and occupational therapy evaluations completed 03/16/2015 with recommendations of physical medicine rehabilitation consult. Patient was admitted for a comprehensive rehabilitation program.  Pt seen for MST. BMI indicates morbid obesity. Chart review indicates 100% completion of breakfast. Pt reports that for lunch he ate 3 bites of beef tips and 2 bites of noodles in addition to drinking 2 cups of milk. He states he is unable to consume any more for meal at this time. Pt reports overt feelings of fullness and ongoing, severe pain to head which is affecting overall desire to eat.   Pt was  seen by RD at 1230 and reports that he is to have therapy session at 1300 and that he would like to be able to sleep for some time before session because of pain and concern for therapy being rigorous. RD asked pt if it would be better to visit with him another time and he was appreciative of this offer.  Unable to obtain further information or perform physical assessment at this time. Per chart review, pt has lost 6 lbs (2% body weight) in the past 9 days which is significant for time frame; will continue to monitor for needs. Will order supplements as outlined above. Medications reviewed. Labs reviewed; Ca: 8.3 mg/dL.   Diet Order:  Diet regular Room service appropriate?: Yes; Fluid consistency:: Thin  Skin:  Wound (see comment) (Head incision from 03/15/15)  Last BM:  2/14  Height:   Ht Readings from Last 1 Encounters:  03/15/15 6\' 1"  (1.854 m)    Weight:   Wt Readings from Last 1 Encounters:  03/15/15 309 lb 8.4 oz (140.4 kg)    Ideal Body Weight:  83.64 kg (kg)  BMI:  40.81 kg/m2  Estimated Nutritional Needs:   Kcal:  2400-2600  Protein:  130-140 grams  Fluid:  >/= 2.2 L/day  EDUCATION NEEDS:   No education needs identified at this time     Jarome Matin, RD, LDN Inpatient Clinical Dietitian Pager # [phone] After hours/weekend pager # [phone]

## 2015-03-18 NOTE — Progress Notes (Signed)
[facility]IC[facility] & REHABILITATION     PROGRESS NOTE    Subjective/Complaints: Awake and alert this am. Having a lot of headaches, particularly in frontal area. Surgical site painful too. Sleep affected. +double vision, ataxia  ROS: Pt denies fever, rash/itching, headache,  , nausea, vomiting, abdominal pain, diarrhea, chest pain, shortness of breath, palpitations, dysuria,   back pain, bleeding, anxiety, or depression   Objective: Vital Signs: Blood pressure 166/79, pulse 72, temperature 99.1 F (37.3 C), temperature source Oral, resp. rate 18, SpO2 95 %. 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 CONTRAST:  20mL MULTIHANCE GADOBENATE DIMEGLUMINE 529 MG/ML IV SOLN COMPARISON:  MRI 02/22/2015 FINDINGS: Postop day 1 suboccipital craniotomy for cerebellar cyst and tumor resection. Suboccipital fluid collection measures 5 x 7 cm and likely is CSF. Negative for hydrocephalus. No herniation of the cerebellum into the spinal canal. Interval resection of cyst and enhancing nodule in the superior cerebellar vermis. There remains fluid and gas in this space in the midline. The small enhancing nodule is no longer visualized. There is intrinsic T1 shortening in the cerebellar vermis inferiorly compatible with methemoglobin and blood products. There is mild enhancement of the tentorium bilaterally as well as the posterior fossa dura compatible with small subdural hemorrhage and postop enhancement. Negative for acute infarct. Mild chronic microvascular ischemic change in the cerebral white matter. Mucosal edema in the paranasal sinuses. IMPRESSION: Interval resection of superior cerebellar cyst and enhancing nodule. No residual enhancing nodule seen. Postop fluid collection  in the surgical cavity contains CSF and blood and measures approximately 2 x 3 cm. There is a small amount of methemoglobin in the cerebellar vermis below the surgical resection cavity. Suboccipital fluid collection related to recent surgery and CSF measures 5 x 7cm. Negative for hydrocephalus.  No acute infarct. Electronically Signed   By: Franchot Gallo M.D.   On: 03/16/2015 13:30    Recent Labs  03/16/15 0545  WBC 14.1*  HGB 12.9*  HCT 39.5  PLT 224    Recent Labs  03/16/15 0545  NA 141  K 3.9  CL 106  GLUCOSE 161*  BUN 13  CREATININE 0.93  CALCIUM 8.3*   CBG (last 3)  No results for input(s): GLUCAP in the last 72 hours.  Wt Readings from Last 3 Encounters:  03/15/15 140.4 kg (309 lb 8.4 oz)  03/08/15 137.667 kg (303 lb 8 oz)  03/06/15 142.883 kg (315 lb)    Physical Exam:  Constitutional: He appears well-developed. Appears fatigued  HENT:  Craniectomy site clean and dry with sutures Eyes:  Pupils reactive to light  Neck: Normal range of motion. Neck supple. No thyromegaly present.  Cardiovascular: Normal rate and regular rhythm. no murmur Respiratory: Effort normal and breath sounds normal. No respiratory distress.  GI: Soft. Bowel sounds are normal. He exhibits no distension.  Neurological: Makes good eye contact with examiner. Oriented to person place and date of birth. Follow simple commands.  motor strength is 5/5 bilateral deltoid, biceps, triceps, grip 4 minus at the hip flexor and knee extensor and ankle dorsiflexor bilaterally Sensation intact to light touch bilateral upper and lower limbs Diplopia with confrontation, tracks to all fields however, dysmetria, left upper and left lower ataxia with general movements but with some improvement. Skin: other than surgical incision is intact  Psych: alert and appropriate  Assessment/Plan: 1. Balance/gait deficits secondary to left vestibular mass which require 3+ hours per day of interdisciplinary therapy  in a comprehensive inpatient rehab setting. Physiatrist is providing close team supervision and 24 hour management of active medical problems listed below. Physiatrist and rehab team continue to assess barriers to discharge/monitor patient progress toward functional and medical goals.  Function:  Bathing Bathing position      Bathing parts      Bathing assist        Upper Body Dressing/Undressing Upper body dressing                    Upper body assist        Lower Body Dressing/Undressing Lower body dressing                                  Lower body assist        Toileting Toileting          Toileting assist     Transfers Chair/bed transfer             Locomotion Ambulation           Wheelchair          Cognition Comprehension    Expression    Social Interaction    Problem Solving    Memory     Medical Problem List and Plan: 1. 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  -Taper Decadron over this next 1 week -path still pending  -symptom mgt, prn meclizine for n/v/dizziness 2. DVT Prophylaxis/Anticoagulation: SCDs. Monitor for any signs of DVT.  -mobilize 3. Pain Management: dc hydrocodone---oxycodone trial  -add topamax for neurogenic headache 4. Mood: Prozac 40 mg daily. Team/family to provide emotional support 5. Neuropsych: This patient is capable of making decisions on his own behalf. 6. Skin/Wound Care: Routine skin checks. Continue staples fornow 7. Fluids/Electrolytes/Nutrition: Routine I&O's with follow-up chemistries during this admit. 8. Hypertension. Lisinopril 10 mg daily. BP's should improve with better pain control 9. Hyperlipidemia. Pravachol 10.BPH.Flomax 0.4 mg daily.   LOS (Days) 1 A FACE TO FACE EVALUATION WAS PERFORMED  Carroll,Maxwell T 03/18/2015 8:40 AM

## 2015-03-18 NOTE — Evaluation (Signed)
Physical Therapy Assessment and Plan  Patient Details  Name: Maxwell Carroll MRN: [id number] Date of Birth: [birth year]  PT Diagnosis: Abnormality of gait, Ataxia, Difficulty walking, Low back pain, Muscle weakness, Pain in head and Vertigo of central origin Rehab Potential: Good ELOS: 7-10 days   Today's Date: 03/18/2015 PT Individual Time: 3449-3284 and 1305-1400 PT Individual Time Calculation (min): 25 min and 55 min    Problem List:  Patient Active Problem List   Diagnosis Date Noted  . Ataxia 03/17/2015  . Cerebellar mass 03/15/2015    Past Medical History:  Past Medical History  Diagnosis Date  . Hypercholesteremia   . Cataract   . Arthritis   . Hypertension   . Sleep apnea   . History of bronchitis   . Numbness and tingling in hands   . Herniated disc, cervical   . Lumbar herniated disc   . History of pneumonia   . Depression   . Seasonal allergies   . [name] (abdominal aortic aneurysm) (HCC)     hx of, but no aneurysm on CT 12/01/14   Past Surgical History:  Past Surgical History  Procedure Laterality Date  . Hammer toe surgery    . Replacement total knee Right   . Replacement total knee bilateral    . Eye surgery      cataracts  . Colonoscopy    . Esophagogastroduodenoscopy    . Craniotomy N/A 03/15/2015    Procedure: Suboccipital craniectomy for tumor resection;  Surgeon: Loura Halt Ditty, MD;  Location: [facility];  Service: Neurosurgery;  Laterality: N/A;  Suboccipital craniectomy for tumor resection  . Application of cranial navigation  03/15/2015    Procedure: APPLICATION OF CRANIAL NAVIGATION;  Surgeon: Loura Halt Ditty, MD;  Location: [facility];  Service: Neurosurgery;;  APPLICATION OF CRANIAL NAVIGATION    Assessment & Plan Clinical Impression: Maxwell Carroll is a 70 y.o. right handed male with history of hypertension, bilateral knee replacement, remote tobacco abuse. Patient lives with spouse independent prior to admission. One level home 5  steps to entry. Admitted 03/15/2015 with incidentally discovered posterior fossa cystic mass during workup of back pain in January. Underwent suboccipital craniectomy for tumor resection use of stereotactic navigation 03/15/2015 per Dr. Bevely Palmer. Hospital course pain management. Currently on a clear liquid diet due to nausea and slowly advanced. Follow-up MRI of the brain 03/16/2015 negative for hydrocephalus or acute infarct. Decadron protocol as directed and taper over the next week. Physical and occupational therapy evaluations completed 03/16/2015 with recommendations of physical medicine rehabilitation consult. Patient transferred to CIR on 03/17/2015.   Patient currently requires min with mobility secondary to muscle weakness and muscle joint tightness, decreased cardiorespiratoy endurance and decreased standing balance and decreased balance strategies.  Prior to hospitalization, patient was independent  with mobility and lived with Spouse in a House home.  Home access is 4Stairs to enter.  Patient will benefit from skilled PT intervention to maximize safe functional mobility, minimize fall risk and decrease caregiver burden for planned discharge home with intermittent assist.  Anticipate patient will benefit from follow up OP at discharge.  PT - End of Session Activity Tolerance: Decreased this session;Tolerates 30+ min activity with multiple rests Endurance Deficit: Yes PT Assessment Rehab Potential (ACUTE/IP ONLY): Good PT Patient demonstrates impairments in the following area(s): Balance;Endurance;Motor;Nutrition;Pain PT Transfers Functional Problem(s): Bed Mobility;Bed to Chair;Car;Furniture PT Locomotion Functional Problem(s): Stairs;Ambulation PT Plan PT Intensity: Minimum of 1-2 x/day ,45 to 90 minutes PT Frequency: 5 out  of 7 days PT Duration Estimated Length of Stay: 7-10 days PT Treatment/Interventions: Ambulation/gait training;Balance/vestibular training;Community  reintegration;Discharge planning;Disease management/prevention;DME/adaptive equipment instruction;Functional mobility training;Neuromuscular re-education;Pain management;Patient/family education;Psychosocial support;Stair training;Therapeutic Activities;Therapeutic Exercise;UE/LE Coordination activities;UE/LE Strength taining/ROM;Visual/perceptual remediation/compensation PT Transfers Anticipated Outcome(s): mod I  PT Locomotion Anticipated Outcome(s): mod I household, supervision community ambulator PT Recommendation Recommendations for Other Services: Neuropsych consult;Vestibular eval Follow Up Recommendations: Outpatient PT Patient destination: Home Equipment Recommended: To be determined Equipment Details: pt owns [facility]  Skilled Therapeutic Intervention Treatment 1: Patient sitting edge of bed, calling out for help with increased anxiety and pain with call bell on floor, no bed alarm engaged, and phone in desk drawer. Patient requesting to go to bathroom, performed sit > stand from edge of bed and ambulated to bathroom with min HHA and cues to limit grabbing onto objects. Patient performed toilet transfer using grab bar with steady assist. RN notified of patient with increased headache pain and administered medication. Patient assisted back to bed with min guard and transferred sit > supine with use of rail and supervision. Patient reported that he was unable to find anything to call for assistance but knew that he was not supposed to get up in room by himself and continuously apologized for calling out for help, patient educated on good safety awareness and need to keep call bell within reach. Patient also c/o visual disturbances including floaters, intermittent blurriness, and seeing a "red curtain," RN aware and notified MD. Patient left supine in bed with pillows positioned on either side of head at patient request with needs within reach and bed alarm on.  Treatment 2: Skilled therapeutic  intervention initiated after completion of evaluation. Discussed with patient falls risk, safety within room, and focus of therapy during stay. Discussed possible length of stay, goals, and follow-up therapy. Patient required min guard HHA overall for community distance ambulation, transfers, and stairs with 2 rails and supervision for bed mobility. Performed toileting with min guard for toilet transfer. Patient with 8/10 back pain and 9/10 frontal headache pain, RN administered pain medications during session. Discussed with RN possible bariatric bed for patient who suspects back pain is from current hospital bed. Patient with minimal neck AROM and movement is slow and guarded. Patient returned to bed at end of session, left supine with pillows on either side of head, needs within reach, and bed alarm on.   PT Evaluation Precautions/Restrictions Precautions Precautions: Fall Restrictions Weight Bearing Restrictions: No General Chart Reviewed: Yes Family/Caregiver Present: No Vital SignsTherapy Vitals Temp: 97.7 F (36.5 C) Temp Source: Oral Pulse Rate: (!) 101 Resp: 18 BP: (!) 148/73 mmHg Patient Position (if appropriate): Sitting (after ambulation) Oxygen Therapy SpO2: 94 % O2 Device: Not Delivered Pain Pain Assessment Pain Assessment: 0-10 Pain Score: 9  Faces Pain Scale: Hurts whole lot Pain Type: Acute pain Pain Location: Head Pain Orientation: Anterior Pain Descriptors / Indicators: Aching;Headache Pain Onset: On-going Pain Intervention(s): Medication (See eMAR) Home Living/Prior Functioning Home Living Available Help at Discharge: Family;Available 24 hours/day Type of Home: House Home Access: Stairs to enter [employer] of Steps: 4 Entrance Stairs-Rails: Left;Right;Can reach both Home Layout: One level Bathroom Shower/Tub: Curtain;Other (comment);Tub/shower unit (grab bar) Bathroom Toilet: Handicapped height Bathroom Accessibility: Yes  Lives With:  Spouse Prior Function Level of Independence: Independent with basic ADLs;Independent with gait;Independent with transfers  Able to Take Stairs?: Yes Driving: No (wife "doesn't trust me to drive") Vocation: Other (comment) (Retired Designer, industrial/product, 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 ) Leisure: Hobbies-yes (Comment) Comments:  reading, crossword puzzles, gardening Vision/Perception  Diploplia, peripheral vision impairment, blurring of vision Patient c/o "floaters" and "red curtain," RN and MD aware Cognition Overall Cognitive Status: Difficult to assess Arousal/Alertness: Awake/alert Orientation Level: Oriented X4 Attention: Selective Selective Attention: Appears intact Memory: Appears intact Awareness: Appears intact Problem Solving: Impaired Problem Solving Impairment: Functional complex Behaviors: Other (comment) (anxious) Safety/Judgment: Appears intact Sensation Sensation Light Touch: Impaired by gross assessment (numbness in B fingertips) Stereognosis: Not tested Hot/Cold: Appears Intact Proprioception: Appears Intact Coordination Gross Motor Movements are Fluid and Coordinated: Yes Fine Motor Movements are Fluid and Coordinated: No Finger Nose Finger Test: increased time and decreased smoothness LUE Heel Shin Test: mild impairment LLE Motor  Motor Motor: Ataxia Motor - Skilled Clinical Observations: mild L sided ataxia, patient compensates with slow cautious movements  Mobility Bed Mobility Bed Mobility: Supine to Sit;Sit to Supine Supine to Sit: 5: Supervision;HOB flat;With rails Sit to Supine: 5: Supervision;HOB flat;With rail Transfers Transfers: Yes Sit to Stand: 4: Min guard Stand to Sit: 4: Min guard Locomotion  Ambulation Ambulation: Yes Ambulation/Gait Assistance: 4: Min guard Ambulation Distance (Feet): 170 Feet Assistive device: 1 person hand held assist Gait Gait: Yes Gait Pattern: Impaired Gait Pattern: Step-through pattern;Shuffle;Decreased  trunk rotation Gait velocity: decreased Stairs / Additional Locomotion Stairs: Yes Stairs Assistance: 4: Min guard Stair Management Technique: Two rails;Forwards Number of Stairs: 12 Height of Stairs: 3 (8 3", 4 6") Ramp: Not tested (comment) Curb: Not tested (comment) Wheelchair Mobility Wheelchair Mobility: No (patient ambulatory)  Trunk/Postural Assessment  Cervical Assessment Cervical Assessment: Exceptions to [facility] Cervical AROM Overall Cervical AROM: Deficits;Due to pain Overall Cervical AROM Comments: keeps head as still as possible, tolerates very little rotation to R and L Thoracic Assessment Thoracic Assessment: Within Functional Limits Lumbar Assessment Lumbar Assessment: Exceptions to [facility] (posterior pelvic tilt) Postural Control Postural Control: Deficits on evaluation Protective Responses: delayed/impaired  Balance Balance Balance Assessed: Yes Static Sitting Balance Static Sitting - Balance Support: Feet supported Static Sitting - Level of Assistance: 6: Modified independent (Device/Increase time) Static Standing Balance Static Standing - Balance Support: During functional activity;No upper extremity supported Static Standing - Level of Assistance: 5: Stand by assistance Dynamic Standing Balance Dynamic Standing - Balance Support: During functional activity;No upper extremity supported Dynamic Standing - Level of Assistance: 4: Min assist Extremity Assessment  RLE Assessment RLE Assessment: Within Functional Limits LLE Assessment LLE Assessment: Within Functional Limits   See Function Navigator for Current Functional Status.   Refer to Care Plan for Long Term Goals  Recommendations for other services: Neuropsych and Other: Vestibular evaluation  Discharge Criteria: Patient will be discharged from PT if patient refuses treatment 3 consecutive times without medical reason, if treatment goals not met, if there is a change in medical status, if patient makes  no progress towards goals or if patient is discharged from hospital.  The above assessment, treatment plan, treatment alternatives and goals were discussed and mutually agreed upon: by patient  Laretta Alstrom 03/18/2015, 2:15 PM

## 2015-03-18 NOTE — Evaluation (Signed)
Speech Language Pathology Assessment and Plan  Patient Details  Name: Maxwell Carroll MRN: [id number] Date of Birth: October 25, 1945  SLP Diagnosis: Cognitive Impairments  Rehab Potential: Good ELOS: 5-7 days for ST only     Today's Date: 03/18/2015 SLP Individual Time: 8835-8446 SLP Individual Time Calculation (min): 55 min   Problem List:  Patient Active Problem List   Diagnosis Date Noted  . Ataxia 03/17/2015  . 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 Hypercholesteremia   . Cataract   . Arthritis   . Hypertension   . Sleep apnea   . History of bronchitis   . Numbness and tingling in hands   . Herniated disc, cervical   . Lumbar herniated disc   . History of pneumonia   . Depression   . Seasonal allergies   . [name] (abdominal aortic aneurysm) (HCC)     hx of, but no aneurysm on CT 12/01/14   Past Surgical History:  Past Surgical History  Procedure Laterality Date  . Hammer toe surgery    . Replacement total knee Right   . Replacement total knee bilateral    . Eye surgery      cataracts  . Colonoscopy    . Esophagogastroduodenoscopy    . Craniotomy N/A 03/15/2015    Procedure: Suboccipital craniectomy for tumor resection;  Surgeon: Loura Halt Ditty, MD;  Location: [facility];  Service: Neurosurgery;  Laterality: N/A;  Suboccipital craniectomy for tumor resection  . Application of cranial navigation  03/15/2015    Procedure: APPLICATION OF CRANIAL NAVIGATION;  Surgeon: Loura Halt Ditty, MD;  Location: [facility];  Service: Neurosurgery;;  APPLICATION OF CRANIAL NAVIGATION    Assessment / Plan / Recommendation Clinical Impression  Maxwell Carroll is a 70 y.o. right handed male with history of hypertension, bilateral knee replacement, remote tobacco abuse.  Admitted 03/15/2015 with incidentally discovered posterior fossa cystic mass during workup of back pain in January. Underwent suboccipital craniectomy for tumor  resection use of stereotactic navigation 03/15/2015 per Dr. Bevely Palmer. Follow-up MRI of the brain 03/16/2015 negative for hydrocephalus or acute infarct. Patient was admitted for a comprehensive rehabilitation program 03/17/2015.  SLP evaluation completed on 03/18/2015 with the following results: Pt scored 20/22 on the MoCA-Blind (n>/= 18) but reports feeling "very different" from baseline.  Anticipate changes to be related to medications and fatigue as pt reports poor sleep ("sleeping in fits") since surgery; however, since pt was independent prior to admission he would benefit from brief ST follow up while inpatient for ongoing diagnostic treatment of higher level cognitive processes.  Do not anticipate ST follow up needs at discharge.    Skilled Therapeutic Interventions          Cognitive-linguistic evaluation completed with results and recommendations reviewed with patient.     SLP Assessment  Patient will need skilled Speech Lanaguage Pathology Services during CIR admission    Recommendations  Recommendations for Other Services: Neuropsych consult Patient destination: Home Follow up Recommendations: None Equipment Recommended: None recommended by SLP    SLP Frequency 1 to 3 out of 7 days   SLP Duration  SLP Intensity  SLP Treatment/Interventions 5-7 days for ST only   Minumum of 1-2 x/day, 30 to 90 minutes  Cognitive remediation/compensation;Cueing hierarchy;Environmental controls;Internal/external aids    Pain Pain Assessment Pain Assessment: Faces Faces Pain Scale: Hurts whole lot Pain Type: Acute pain Pain Location: Head Pain Orientation: Anterior;Posterior Pain Descriptors /  Indicators: Headache Pain Frequency: Constant Pain Onset: On-going Pain Intervention(s): RN made aware  Prior Functioning Cognitive/Linguistic Baseline: Within functional limits Type of Home: House  Lives With: Spouse Available Help at Discharge: Family;Available 24 hours/day Education: finished high  school  Vocation: Other (comment) (Retired Designer, industrial/product, 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  Problem Solving Problem solving assist level: Solves basic 90% of the time/requires cueing < 10% of the time  Memory Memory assist level: More than reasonable amount of time   Short Term Goals: Week 1: SLP Short Term Goal 1 (Week 1): Pt will complete semi-complex home management tasks for >80% accuracy with mod I.    Refer to Care Plan for Long Term Goals  Recommendations for other services: Neuropsych  Discharge Criteria: Patient will be discharged from SLP if patient refuses treatment 3 consecutive times without medical reason, if treatment goals not met, if there is a change in medical status, if patient makes no progress towards goals or if patient is discharged from hospital.  The above assessment, treatment plan, treatment alternatives and goals were discussed and mutually agreed upon: by patient  Emilio Math 03/18/2015, 12:37 PM

## 2015-03-19 ENCOUNTER — Inpatient Hospital Stay (HOSPITAL_COMMUNITY): Payer: Medicare Other | Admitting: Occupational Therapy

## 2015-03-19 DIAGNOSIS — G441 Vascular headache, not elsewhere classified: Secondary | ICD-10-CM

## 2015-03-19 MED ORDER — BISACODYL 10 MG RE SUPP
10.0000 mg | Freq: Every day | RECTAL | Status: DC | PRN
  Administered 2015-03-19: 10 mg via RECTAL
  Filled 2015-03-19: qty 1

## 2015-03-19 NOTE — Plan of Care (Signed)
Problem: RH BOWEL ELIMINATION Goal: RH STG MANAGE BOWEL WITH ASSISTANCE STG Manage Bowel with Min Assistance.  Outcome: Not Progressing No bowel movement

## 2015-03-19 NOTE — Progress Notes (Signed)
Occupational Therapy Session Note  Patient Details  Name: Maxwell Carroll MRN: [id number] Date of Birth: [birth year]  Today's Date: 03/19/2015 OT Individual Time: [id number] OT Individual Time Calculation (min): 60 min    Short Term Goals: Week 1:  OT Short Term Goal 1 (Week 1): Pt. will perform grooming in standing for 5 minutes at sink. OT Short Term Goal 2 (Week 1): Pt. will engage in bathing with SBA OT Short Term Goal 3 (Week 1): Pt will engage in dressing at [place] with sit to stand for pants OT Short Term Goal 4 (Week 1): Pt will transfer to toilet with SBA OT Short Term Goal 5 (Week 1): Pt. will transfer to tub shower with min assist  Skilled Therapeutic Interventions/Progress Updates:    Engaged in therapeutic bathing and dressing at shower level. Addressed balance, mobility, visual scanning and searching.  Pt. Went from supine to EOB with bed rails with  SBA.  Pt engaged in functional mobility to shower and performed bathing in seated and standing position.  Pt dressed on EOtoilet.  SEE FX.  Ambulated back to bed and rested from activity.  Left with all needs in reach.    Therapy Documentation Precautions:  Precautions Precautions: Fall Restrictions Weight Bearing Restrictions: No    Vital Signs: Therapy Vitals Temp: 99.7 F (37.6 C) Temp Source: Oral Pulse Rate: 66 Resp: 18 BP: (!) 172/97 mmHg Patient Position (if appropriate): Lying Oxygen Therapy SpO2: 92 % O2 Device: Not Delivered Pain: Pain Assessment Pain Assessment: 0-10 Pain Score: 7 Pain Type: Acute pain Pain Location: Head Pain Orientation: Anterior Pain Descriptors / Indicators: Aching;Headache Pain Frequency: Constant Pain Onset: On-going Pain Intervention(s): Medication (See eMAR)       See Function Navigator for Current Functional Status.   Therapy/Group: Individual Therapy  Lisa Roca 03/19/2015, 4:32 PM

## 2015-03-19 NOTE — Progress Notes (Signed)
[place] PHYSICAL MEDICINE & REHABILITATION     PROGRESS NOTE    Subjective/Complaints: Headaches improved with dilaudid/?topamax. Oxycodone was ineffective. Able to sleep last night. Back feeling better too  ROS: Pt denies fever, rash/itching, headache,  , nausea, vomiting, abdominal pain, diarrhea, chest pain, shortness of breath, palpitations, dysuria,   back pain, bleeding, anxiety, or depression   Objective: Vital Signs: Blood pressure 152/75, pulse 66, temperature 98.4 F (36.9 C), temperature source Oral, resp. rate 17, SpO2 96 %. No results found. No results for input(s): WBC, HGB, HCT, PLT in the last 72 hours. No results for input(s): NA, K, CL, GLUCOSE, BUN, CREATININE, CALCIUM in the last 72 hours.  Invalid input(s): CO CBG (last 3)  No results for input(s): GLUCAP in the last 72 hours.  Wt Readings from Last 3 Encounters:  03/15/15 140.4 kg (309 lb 8.4 oz)  03/08/15 137.667 kg (303 lb 8 oz)  03/06/15 142.883 kg (315 lb)    Physical Exam:  Constitutional: He appears well-developed. Appears fatigued  HENT:  Craniectomy site clean and dry with sutures Eyes:  Pupils reactive to light  Neck: Normal range of motion. Neck supple. No thyromegaly present.  Cardiovascular: Normal rate and regular rhythm. no murmur Respiratory: Effort normal and breath sounds normal. No respiratory distress.  GI: Soft. Bowel sounds are normal. He exhibits no distension.  Neurological: Makes good eye contact with examiner. Oriented to person place and date of birth. Follow simple commands.  motor strength is 5/5 bilateral deltoid, biceps, triceps, grip 4 minus at the hip flexor and knee extensor and ankle dorsiflexor bilaterally Sensation intact to light touch bilateral upper and lower limbs Diplopia with confrontation,all visual fields intact.  tracks to all fields however, dysmetria, left upper and left lower ataxia with general movements but with some improvement.   Skin:  other than surgical incision is intact Psych: alert and appropriate  Assessment/Plan: 1. Balance/gait deficits secondary to left vestibular mass which require 3+ hours per day of interdisciplinary therapy in a comprehensive inpatient rehab setting. Physiatrist is providing close team supervision and 24 hour management of active medical problems listed below. Physiatrist and rehab team continue to assess barriers to discharge/monitor patient progress toward functional and medical goals.  Function:  Bathing Bathing position Bathing activity did not occur: Refused    Bathing parts      Bathing assist        Upper Body Dressing/Undressing Upper body dressing   What is the patient wearing?: Hospital gown                Upper body assist        Lower Body Dressing/Undressing Lower body dressing   What is the patient wearing?: Hospital Gown                              Lower body assist        Toileting Toileting   Toileting steps completed by patient: Adjust clothing prior to toileting, Performs perineal hygiene, Adjust clothing after toileting   Toileting Assistive Devices: Grab bar or rail  Toileting assist Assist level: Touching or steadying assistance (Pt.75%)   Transfers Chair/bed transfer   Chair/bed transfer method: Ambulatory Chair/bed transfer assist level: Touching or steadying assistance (Pt > 75%) Chair/bed transfer assistive device: Armrests     Locomotion Ambulation     Max distance: 170  Assist level: Touching or steadying assistance (Pt > 75%)  Wheelchair          Cognition Comprehension Comprehension assist level: Follows complex conversation/direction with no assist  Expression Expression assist level: Expresses complex ideas: With no assist  Social Interaction Social Interaction assist level: Interacts appropriately with others with medication or extra time (anti-anxiety, antidepressant).  Problem Solving Problem solving  assist level: Solves basic 90% of the time/requires cueing < 10% of the time  Memory Memory assist level: More than reasonable amount of time   Medical Problem List and Plan: 1. 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  -Taper Decadron over this next 1 week -path  pending  -has had some transient visual sx (floaters/spots) which seem to present when pain is severe---improved today  -observe closely for persistence of pain and any visual sx----f/u CT if needed 2. DVT Prophylaxis/Anticoagulation: SCDs. Monitor for any signs of DVT.  -mobilize 3. Pain Management: will keep prn oxycodone on board for now  -dilaudid for h/a and severe pain  -continue topamax for neurogenic headache 4. Mood: Prozac 40 mg daily. Team/family to provide emotional support 5. Neuropsych: This patient is capable of making decisions on his own behalf. 6. Skin/Wound Care: Routine skin checks. Continue staples for now 7. Fluids/Electrolytes/Nutrition: Routine I&O's with follow-up chemistries tomorrow. 8. Hypertension. Lisinopril 10 mg daily. BP's should improve with better pain control 9. Hyperlipidemia. Pravachol 10.BPH.Flomax 0.4 mg daily.   LOS (Days) 2 A FACE TO FACE EVALUATION WAS PERFORMED  SWARTZ,ZACHARY T 03/19/2015 8:13 AM

## 2015-03-20 ENCOUNTER — Inpatient Hospital Stay (HOSPITAL_COMMUNITY): Payer: Self-pay | Admitting: Physical Therapy

## 2015-03-20 ENCOUNTER — Inpatient Hospital Stay (HOSPITAL_COMMUNITY): Payer: Medicare Other | Admitting: Occupational Therapy

## 2015-03-20 ENCOUNTER — Inpatient Hospital Stay (HOSPITAL_COMMUNITY): Payer: Medicare Other | Admitting: Speech Pathology

## 2015-03-20 DIAGNOSIS — D72829 Elevated white blood cell count, unspecified: Secondary | ICD-10-CM | POA: Insufficient documentation

## 2015-03-20 DIAGNOSIS — G4459 Other complicated headache syndrome: Secondary | ICD-10-CM | POA: Insufficient documentation

## 2015-03-20 DIAGNOSIS — I10 Essential (primary) hypertension: Secondary | ICD-10-CM | POA: Insufficient documentation

## 2015-03-20 LAB — CBC WITH DIFFERENTIAL/PLATELET
BASOS ABS: 0 10*3/uL (ref 0.0–0.1)
Basophils Relative: 0 %
EOS ABS: 0 10*3/uL (ref 0.0–0.7)
EOS PCT: 0 %
HCT: 44.8 % (ref 39.0–52.0)
Hemoglobin: 15.3 g/dL (ref 13.0–17.0)
LYMPHS ABS: 1.5 10*3/uL (ref 0.7–4.0)
LYMPHS PCT: 14 %
MCH: 30.8 pg (ref 26.0–34.0)
MCHC: 34.2 g/dL (ref 30.0–36.0)
MCV: 90.1 fL (ref 78.0–100.0)
MONO ABS: 0.8 10*3/uL (ref 0.1–1.0)
Monocytes Relative: 8 %
Neutro Abs: 8.5 10*3/uL — ABNORMAL HIGH (ref 1.7–7.7)
Neutrophils Relative %: 78 %
PLATELETS: 313 10*3/uL (ref 150–400)
RBC: 4.97 MIL/uL (ref 4.22–5.81)
RDW: 12.7 % (ref 11.5–15.5)
WBC: 10.9 10*3/uL — ABNORMAL HIGH (ref 4.0–10.5)

## 2015-03-20 LAB — COMPREHENSIVE METABOLIC PANEL
ALT: 25 U/L (ref 17–63)
AST: 17 U/L (ref 15–41)
Albumin: 3.4 g/dL — ABNORMAL LOW (ref 3.5–5.0)
Alkaline Phosphatase: 47 U/L (ref 38–126)
Anion gap: 12 (ref 5–15)
BUN: 20 mg/dL (ref 6–20)
CHLORIDE: 102 mmol/L (ref 101–111)
CO2: 23 mmol/L (ref 22–32)
Calcium: 9.2 mg/dL (ref 8.9–10.3)
Creatinine, Ser: 0.95 mg/dL (ref 0.61–1.24)
Glucose, Bld: 138 mg/dL — ABNORMAL HIGH (ref 65–99)
POTASSIUM: 3.6 mmol/L (ref 3.5–5.1)
SODIUM: 137 mmol/L (ref 135–145)
Total Bilirubin: 1 mg/dL (ref 0.3–1.2)
Total Protein: 7.5 g/dL (ref 6.5–8.1)

## 2015-03-20 MED ORDER — DEXAMETHASONE 4 MG PO TABS
4.0000 mg | ORAL_TABLET | Freq: Every day | ORAL | Status: DC
  Administered 2015-03-21 – 2015-03-22 (×2): 4 mg via ORAL
  Filled 2015-03-20 (×2): qty 1

## 2015-03-20 MED ORDER — LISINOPRIL 20 MG PO TABS
20.0000 mg | ORAL_TABLET | Freq: Every day | ORAL | Status: DC
  Administered 2015-03-21 – 2015-03-24 (×4): 20 mg via ORAL
  Filled 2015-03-20 (×4): qty 1

## 2015-03-20 NOTE — Progress Notes (Signed)
Social Work  Social Work Assessment and Plan  Patient Details  Name: Maxwell Carroll MRN: [id number] Date of Birth: Jul 28, 1945  Today's Date: 03/20/2015  Problem List:  Patient Active Problem List   Diagnosis Date Noted  . Other complicated headache syndrome   . Leukocytosis   . Essential hypertension   . Ataxia 03/17/2015  . 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 Hypercholesteremia   . Cataract   . Arthritis   . Hypertension   . Sleep apnea   . History of bronchitis   . Numbness and tingling in hands   . Herniated disc, cervical   . Lumbar herniated disc   . History of pneumonia   . Depression   . Seasonal allergies   . [name] (abdominal aortic aneurysm) (HCC)     hx of, but no aneurysm on CT 12/01/14   Past Surgical History:  Past Surgical History  Procedure Laterality Date  . Hammer toe surgery    . Replacement total knee Right   . Replacement total knee bilateral    . Eye surgery      cataracts  . Colonoscopy    . Esophagogastroduodenoscopy    . Craniotomy N/A 03/15/2015    Procedure: Suboccipital craniectomy for tumor resection;  Surgeon: Kevan Ny Ditty, MD;  Location: [facility];  Service: Neurosurgery;  Laterality: N/A;  Suboccipital craniectomy for tumor resection  . Application of cranial navigation  03/15/2015    Procedure: APPLICATION OF CRANIAL NAVIGATION;  Surgeon: Kevan Ny Ditty, MD;  Location: [facility];  Service: Neurosurgery;;  APPLICATION OF CRANIAL NAVIGATION   Social History:  reports that he quit smoking about 27 years ago. He has never used smokeless tobacco. He reports that he drinks alcohol. He reports that he does not use illicit drugs.  Family / Support Systems Marital Status: Married How Long?: 98 yrs Patient Roles: Spouse, Parent Spouse/Significant Other: wife, Rhandy Morelock @ [phone] Children: daughter (lives very close by), Carson Myrtle @ (C) [phone] Anticipated  Caregiver: wife Ability/Limitations of Caregiver: no physical limitations Caregiver Availability: 24/7 Family Dynamics: Pt speaks very affectionately about his wife and daughter.  Denies any concerns about their support upon d/c.    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 Legal Hisotry/Current Legal Issues: None Guardian/Conservator: None - per MD, pt is capable of making decisions on his own behalf.   Abuse/Neglect Physical Abuse: Denies Verbal Abuse: Denies Sexual Abuse: Denies Exploitation of patient/patient's resources: Denies Self-Neglect: Denies  Emotional Status Pt's affect, behavior adn adjustment status: Pt lying in bed with cool cloth over forehead.  He is very receptive to request for assessment interview and very pleasant throughout. He becomes briefly tearful at times when he speaks of his family and the support they are showing.  He admits that he is concerned about his wife as she "is not handling this very well...really stressed and also worried about the finances...".  In regards to the brain mass, he describes himself as "grateful" that it was found accidently as he was receiving imaging/ work up for severe back pain.  He denies any significant emotional distress.  He is hopeful he will make good progress and that path report will come back with news  of tumore being benign.  Does not endorse any depression s/s at this time, however, will monitor and refer to neuropsychology as indicated. 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  Never any formal counseling. Substance Abuse History: None  Patient / Family  Perceptions, Expectations & Goals Pt/Family understanding of illness & functional limitations: Pt and wife with good understanding of the "sack and tumor near my brainstem" and of the surgical intervention done.  Good understanding of his functional limitations/ need for CIR. 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  Independent until a couple of weeks prior. Anticipated changes in roles/activities/participation: Pt has goals of mod independent so would expect little change except will not be able to immedicately return to helping with his wife's cleaning business.  Pt/family expectations/goals: "I just hope I make progress...they tell me I'm doing really well but I don't see it..."  [employer]: None Premorbid Home Care/DME Agencies: None Transportation available at discharge: yes Resource referrals recommended: Neuropsychology  Discharge Planning Living Arrangements: Spouse/significant other Support Systems: Spouse/significant other, Children, Other relatives, Friends/neighbors Type of Residence: Private residence Insurance Resources: [employer] ([place] Medicare) Museum/gallery curator Resources: Fish farm manager, Employment Financial Screen Referred: No Living Expenses: Education officer, community Management: Patient Does the patient have any problems obtaining your medications?: No Home Management: pt and wife share Patient/Family Preliminary Plans: Pt to return home wife as primary support. Social Work Anticipated Follow Up Needs: HH/OP Expected length of stay: 7-10 days  Clinical Impression Very pleasant gentleman here following removal of mass/ tumor in brain.  Making good progress, but continues to have a lot of pain.  He is motivated for therapies.  Denies any significant emotional distress, but does note he is concerned about how his wife is managing.  Notes she is very stressed by his medical situation as well as their financial  strains.  Will follow for support and d/c planning needs.  Rochell Puett 03/20/2015, 3:51 PM

## 2015-03-20 NOTE — Care Management Note (Signed)
[facility] Individual Statement of Services  Patient Name:  Maxwell Carroll  Date:  03/20/2015  Welcome to the [facility].  Our goal is to provide you with an individualized program based on your diagnosis and situation, designed to meet your specific needs.  With this comprehensive rehabilitation program, you will be expected to participate in at least 3 hours of rehabilitation therapies Monday-Friday, with modified therapy programming on the weekends.  Your rehabilitation program will include the following services:  Physical Therapy (PT), Occupational Therapy (OT), Speech Therapy (ST), 24 hour per day rehabilitation nursing, Therapeutic Recreaction (TR), Neuropsychology, Case Management (Social Worker), Rehabilitation Medicine, Nutrition Services and Pharmacy Services  Weekly team conferences will be held on Wednesdays to discuss your progress.  Your Social Worker will talk with you frequently to get your input and to update you on team discussions.  Team conferences with you and your family in attendance may also be held.  Expected length of stay: 10-14 days  Overall anticipated outcome: modified independent  Depending on your progress and recovery, your program may change. Your Social Worker will coordinate services and will keep you informed of any changes. Your Social Worker's name and contact numbers are listed  below.  The following services may also be recommended but are not provided by the [facility] will be made to provide these services after discharge if needed.  Arrangements include referral to agencies that provide these services.  Your insurance has been verified to be:  [facility] Medicare Your primary doctor is:  Ramonita Lab, MD  Pertinent information will be shared with your doctor and your insurance  company.  Social Worker:  [place], [place] or ([phone]   Information discussed with and copy given to patient by: Lennart Pall, 03/20/2015, 3:56 PM

## 2015-03-20 NOTE — Progress Notes (Signed)
Occupational Therapy Session Note  Patient Details  Name: Maxwell Carroll MRN: [id number] Date of Birth: [birth year]  Today's Date: 03/20/2015 OT Individual Time: [id number] OT Individual Time Calculation (min): 71 min    Short Term Goals: Week 1:  OT Short Term Goal 1 (Week 1): Pt. will perform grooming in standing for 5 minutes at sink. OT Short Term Goal 2 (Week 1): Pt. will engage in bathing with SBA OT Short Term Goal 3 (Week 1): Pt will engage in dressing at [place] with sit to stand for pants OT Short Term Goal 4 (Week 1): Pt will transfer to toilet with SBA OT Short Term Goal 5 (Week 1): Pt. will transfer to tub shower with min assist  Skilled Therapeutic Interventions/Progress Updates:  Upon entering the room, pt supine in bed with 8/10 c/o pain described as headache. RN arrived to give pain medications during this session. Pt performed supine >sit with steady assistance to EOB. Pt requiring multiple rest breaks this session with cues for pursed lip breathing. Incentive spirometer was also given during this session. Pt ambulating in room with steady assistance to bathroom for toileting. Pt performed toilet transfer, hygiene, and clothing management with steady assist for safety. Pt standing at sink for grooming tasks with close supervision and pt supporting self with 1 UE during task. Pt declined bathing this session but ambulated to dresser and squats to obtain clothing items with steady assist. Pt dressed with sit <>stand from chair with steady assistance. Pt seated in recliner chair at end of session with call bell and all needed items within reach upon exiting the room.   Therapy Documentation Precautions:  Precautions Precautions: Fall Restrictions Weight Bearing Restrictions: No Vital Signs: Therapy Vitals Temp: 97.9 F (36.6 C) Temp Source: Oral Pulse Rate: 95 Resp: 18 BP: (!) 149/88 mmHg Patient Position (if appropriate): Lying Oxygen Therapy SpO2: 96 % O2 Device: Not  Delivered Pain: Pain Assessment Pain Score: 8   See Function Navigator for Current Functional Status.   Therapy/Group: Individual Therapy  Phineas Semen 03/20/2015, 8:15 AM

## 2015-03-20 NOTE — IPOC Note (Signed)
Overall Plan of Care [facility]) Patient Details Name: Maxwell Carroll MRN: [id number] DOB: [birth year]  Admitting Diagnosis: Tumor Resection  Hospital Problems: Principal Problem:   Cerebellar mass Active Problems:   Ataxia   Other complicated headache syndrome   Leukocytosis   Essential hypertension     Functional Problem List: Nursing Bowel, Bladder, Endurance, Medication Management, Motor, Pain, Safety, Skin Integrity  PT Balance, Endurance, Motor, Nutrition, Pain  OT Endurance, Pain, Vision, Balance  SLP Cognition  TR         Basic ADL's: OT Grooming, Bathing, Dressing, Toileting     Advanced  ADL's: OT Simple Meal Preparation, Laundry, Light Housekeeping     Transfers: PT Bed Mobility, Bed to Chair, Car, Manufacturing systems engineer, Metallurgist: PT Stairs, Ambulation     Additional Impairments: OT None  SLP Social Cognition   Problem Solving  TR      Anticipated Outcomes Item Anticipated Outcome  Self Feeding endependent  Swallowing      Basic self-care  independent   Toileting  independent   Bathroom Transfers Mod I  Bowel/Bladder  Patient will remain continent of bowel and bladder at discharge  Transfers  mod I   Locomotion  mod I household, supervision community ambulator  Communication     Cognition  mod I   Pain  Pain at or below 3 at discharge  Safety/Judgment  No falls at discharge   Therapy Plan: PT Intensity: Minimum of 1-2 x/day ,45 to 90 minutes PT Frequency: 5 out of 7 days PT Duration Estimated Length of Stay: 7-10 days OT Intensity: Minimum of 1-2 x/day, 45 to 90 minutes OT Frequency: 5 out of 7 days OT Duration/Estimated Length of Stay: 14- 16 days SLP Intensity: Minumum of 1-2 x/day, 30 to 90 minutes SLP Frequency: 1 to 3 out of 7 days SLP Duration/Estimated Length of Stay: 5-7 days for ST only        Team Interventions: Nursing Interventions Patient/Family Education, Bladder Management, Bowel Management, Disease  Management/Prevention, Pain Management, Medication Management, Psychosocial Support  PT interventions Ambulation/gait training, Training and development officer, Community reintegration, Discharge planning, Disease management/prevention, DME/adaptive equipment instruction, Functional mobility training, Neuromuscular re-education, Pain management, Patient/family education, Psychosocial support, Stair training, Therapeutic Activities, Therapeutic Exercise, UE/LE Coordination activities, UE/LE Strength taining/ROM, Visual/perceptual remediation/compensation  OT Interventions Training and development officer, DME/adaptive equipment instruction, Functional mobility training, Pain management, Patient/family education, Psychosocial support, Self Care/advanced ADL retraining, Therapeutic Activities, Therapeutic Exercise, UE/LE Strength taining/ROM, UE/LE Coordination activities  SLP Interventions Cognitive remediation/compensation, Cueing hierarchy, Environmental controls, Internal/external aids  TR Interventions    SW/CM Interventions Discharge Planning, Psychosocial Support, Patient/Family Education    Team Discharge Planning: Destination: PT-Home ,OT- Home , SLP-Home Projected Follow-up: PT-Outpatient PT, OT-  Home health OT, SLP-None Projected Equipment Needs: PT-To be determined, OT- Tub/shower bench, SLP-None recommended by SLP Equipment Details: PT-pt owns SPC, OT-  Patient/family involved in discharge planning: PT- Patient,  OT-Patient, SLP-Patient  MD ELOS: 10-12 days. Medical Rehab Prognosis:  Good Assessment:  70 y.o. right handed male with history of hypertension, bilateral knee replacement, remote tobacco abuse. Admitted 03/15/2015 with incidentally discovered posterior fossa cystic mass during workup of back pain in January. Underwent suboccipital craniectomy for tumor resection use of stereotactic navigation 03/15/2015 per Dr. Cyndy Freeze. Hospital course pain management. Follow-up MRI of the brain  03/16/2015 negative for hydrocephalus or acute infarct. Decadron protocol as directed and taper over the next week.   See Team Conference Notes for weekly updates to the  plan of care

## 2015-03-20 NOTE — Progress Notes (Signed)
Physical Therapy Session Note  Patient Details  Name: Maxwell Carroll MRN: [id number] Date of Birth: 09-04-1945  Today's Date: 03/20/2015 PT Individual Time: 0906-1005 PT Individual Time Calculation (min): 59 min   Short Term Goals: Week 1:  PT Short Term Goal 1 (Week 1): = LTGs due to anticipated LOS  Skilled Therapeutic Interventions/Progress Updates:    Pt received resting in recliner and agreeable to therapy session.  Session focus on functional transfers, balance, gait training, ROM, and patient education.  Pt transfers with supervision throughout session using UEs for power up.  Pt amb to/from therapy gym with supervision and intermittent use of rails for light balance support when needed.  Patient demonstrates significant fall risk as noted by score of 41/56 on Berg Balance Scale.  (<36= high risk for falls, close to 100%; 37-45 significant >80%; 46-51 moderate >50%; 52-55 lower >25%). Discussed results with pt and provided suggestions for decreasing fall risk, e.g. Taking time, using UEs for support, etc.  PT administered 10 MWT with pt amb .589 m/s indicating increased risk of falling and decreased independence in community setting.  Pt returned to room at end of session and positioned supine in bed with ice applied to posterior neck, call bell in reach, and needs met.    Therapy Documentation Precautions:  Precautions Precautions: Fall Restrictions Weight Bearing Restrictions: No Pain: Pain Assessment Pain Assessment: Faces Faces Pain Scale: Hurts whole lot Pain Type: Surgical pain Pain Location: Neck Pain Intervention(s): RN made aware;Emotional support;Cold applied  Balance: Standardized Balance Assessment Standardized Balance Assessment: Berg Balance Test Berg Balance Test Sit to Stand: Able to stand without using hands and stabilize independently Standing Unsupported: Able to stand safely 2 minutes Sitting with Back Unsupported but Feet Supported on Floor or Stool: Able  to sit safely and securely 2 minutes Stand to Sit: Controls descent by using hands Transfers: Able to transfer safely, definite need of hands Standing Unsupported with Eyes Closed: Able to stand 10 seconds safely Standing Ubsupported with Feet Together: Able to place feet together independently and stand for 1 minute with supervision From Standing, Reach Forward with Outstretched Arm: Can reach forward >12 cm safely (5") From Standing Position, Pick up Object from Floor: Able to pick up shoe, needs supervision From Standing Position, Turn to Look Behind Over each Shoulder: Turn sideways only but maintains balance Turn 360 Degrees: Able to turn 360 degrees safely but slowly Standing Unsupported, Alternately Place Feet on Step/Stool: Able to complete 4 steps without aid or supervision Standing Unsupported, One Foot in Front: Able to plae foot ahead of the other independently and hold 30 seconds Standing on One Leg: Tries to lift leg/unable to hold 3 seconds but remains standing independently Total Score: 41   See Function Navigator for Current Functional Status.   Therapy/Group: Individual Therapy  [name] 03/20/2015, 10:46 AM

## 2015-03-20 NOTE — Progress Notes (Signed)
Speech Language Pathology Daily Session Note  Patient Details  Name: ALIE MORTON MRN: [id number] Date of Birth: Feb 11, 1945  Today's Date: 03/20/2015 SLP Individual Time: [id number] SLP Individual Time Calculation (min): 55 min  Short Term Goals: Week 1: SLP Short Term Goal 1 (Week 1): Pt will complete semi-complex home management tasks for >80% accuracy with mod I.    Skilled Therapeutic Interventions: Skilled treatment session focused on cognitive goals. SLP facilitated session by providing Min A question and verbal cues for recall of his new medications and their functions, however, patient was Mod I for recall of his home medications and their function. Patient was able to organize a pill box for medication management with extra time and Mod I. Patient requested to use the bathroom and was overall supervision with task. Patient left supine in bed with all needs within reach and wife present. Continue with current plan of care.    Function:  Cognition Comprehension Comprehension assist level: Follows complex conversation/direction with no assist  Expression   Expression assist level: Expresses complex ideas: With no assist  Social Interaction Social Interaction assist level: Interacts appropriately with others with medication or extra time (anti-anxiety, antidepressant).  Problem Solving Problem solving assist level: Solves complex problems: Recognizes & self-corrects;Solves complex 90% of the time/cues < 10% of the time  Memory Memory assist level: Recognizes or recalls 90% of the time/requires cueing < 10% of the time    Pain 8/10, headache. Patient premedicated with an ice pack   Therapy/Group: Individual Therapy  Kimm Ungaro 03/20/2015, 4:05 PM

## 2015-03-20 NOTE — Progress Notes (Signed)
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

## 2015-03-20 NOTE — Progress Notes (Signed)
PHYSICAL MEDICINE & REHABILITATION     PROGRESS NOTE  Subjective/Complaints: Patient seen this morning sitting of working with therapies. Complains of dizziness and fatigue with therapies.   ROS: +Dizziness.  Denies nausea, diplopia, CP, SOB, N/V/D   Objective: Vital Signs: Blood pressure 149/88, pulse 95, temperature 97.9 F (36.6 C), temperature source Oral, resp. rate 18, SpO2 96 %. No results found.  Recent Labs  03/20/15 0800  WBC 10.9*  HGB 15.3  HCT 44.8  PLT 313   No results for input(s): NA, K, CL, GLUCOSE, BUN, CREATININE, CALCIUM in the last 72 hours.  Invalid input(s): CO CBG (last 3)  No results for input(s): GLUCAP in the last 72 hours.  Wt Readings from Last 3 Encounters:  03/15/15 140.4 kg (309 lb 8.4 oz)  03/08/15 137.667 kg (303 lb 8 oz)  03/06/15 142.883 kg (315 lb)    Physical Exam:  Constitutional: He appears well-developed. Appears fatigued  HENT:  normocephalic. Bilateral external ears within normal limits Eyes: EOM and conjunctiva are normal. Cardiovascular: Normal rate and regular rhythm. no murmur Respiratory: Effort normal and breath sounds normal. No respiratory distress.  GI: Soft. Bowel sounds are normal. He exhibits no distension.  Neurological:  Makes good eye contact with examiner.  Follow simple commands.  Motor: B/L UE: 4/5 proximal distal with bilateral ataxia, no dysmetria  B/l LE 4+/5 proximal to distal  Skin:  Surgical sight C/D/I. Otherwise warm and dry. Psych: alert and appropriate  Assessment/Plan: 1. Balance/gait deficits secondary to left vestibular mass which require 3+ hours per day of interdisciplinary therapy in a comprehensive inpatient rehab setting. Physiatrist is providing close team supervision and 24 hour management of active medical problems listed below. Physiatrist and rehab team continue to assess barriers to discharge/monitor patient progress toward functional and medical  goals.  Function:  Bathing Bathing position Bathing activity did not occur: Refused Position: Shower  Bathing parts Body parts bathed by patient: Right arm, Left arm, Chest, Abdomen, Front perineal area, Buttocks, Right upper leg, Left upper leg, Right lower leg, Left lower leg Body parts bathed by helper: Back  Bathing assist Assist Level: Supervision or verbal cues      Upper Body Dressing/Undressing Upper body dressing   What is the patient wearing?: Pull over shirt/dress     Pull over shirt/dress - Perfomed by patient: Thread/unthread right sleeve, Thread/unthread left sleeve, Put head through opening, Pull shirt over trunk   Button up shirt - Perfomed by patient: Thread/unthread right sleeve, Thread/unthread left sleeve Button up shirt - Perfomed by helper: Pull shirt around back, Button/unbutton shirt    Upper body assist Assist Level: Touching or steadying assistance(Pt > 75%)      Lower Body Dressing/Undressing Lower body dressing   What is the patient wearing?: Underwear, Pants Underwear - Performed by patient: Thread/unthread right underwear leg, Thread/unthread left underwear leg, Pull underwear up/down Underwear - Performed by helper: Pull underwear up/down Pants- Performed by patient: Thread/unthread right pants leg, Thread/unthread left pants leg, Pull pants up/down Pants- Performed by helper: Pull pants up/down   Non-skid slipper socks- Performed by helper: Don/doff left sock, Don/doff right sock                  Lower body assist Assist for lower body dressing: Touching or steadying assistance (Pt > 75%)      Toileting Toileting Toileting activity did not occur: Refused Toileting steps completed by patient: Adjust clothing prior to toileting, Performs perineal hygiene, Adjust clothing  after toileting   Toileting Assistive Devices: Grab bar or rail  Toileting assist Assist level: Touching or steadying assistance (Pt.75%)   Transfers Chair/bed  transfer   Chair/bed transfer method: Ambulatory Chair/bed transfer assist level: Touching or steadying assistance (Pt > 75%) Chair/bed transfer assistive device: Armrests     Locomotion Ambulation     Max distance: 170  Assist level: Touching or steadying assistance (Pt > 75%)   Wheelchair          Cognition Comprehension Comprehension assist level: Follows complex conversation/direction with no assist  Expression Expression assist level: Expresses complex ideas: With no assist  Social Interaction Social Interaction assist level: Interacts appropriately with others with medication or extra time (anti-anxiety, antidepressant).  Problem Solving Problem solving assist level: Solves basic 90% of the time/requires cueing < 10% of the time  Memory Memory assist level: More than reasonable amount of time   Medical Problem List and Plan: 1. 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  -Cont to wean Decadron and d/c by end of week  -observe closely for persistence of pain and any visual sx----f/u CT if needed 2. DVT Prophylaxis/Anticoagulation: SCDs. Monitor for any signs of DVT.  -mobilize 3. Pain Management: will keep prn oxycodone on board for now 4. Mood: Prozac 40 mg daily. Team/family to provide emotional support 5. Neuropsych: This patient is capable of making decisions on his own behalf. 6. Skin/Wound Care: Routine skin checks. Continue staples for now 7. Fluids/Electrolytes/Nutrition: Routine I&O's with follow-up chemistries tomorrow. 8. Hypertension. Lisinopril 10 mg daily, increased to 20 on 2/20.  9. Hyperlipidemia. Pravachol 10.BPH.Flomax 0.4 mg daily. 11. Neurogenic headaches    Continue Topamax 25  12. Leukocytosis (improving; pt on steroids)   10.9 on 2/20  Will cont to monitor  LOS (Days) 3 A FACE TO FACE EVALUATION WAS PERFORMED  Ankit Lorie Phenix 03/20/2015 8:48 AM

## 2015-03-20 NOTE — Progress Notes (Signed)
Patient information reviewed and entered into eRehab system by Dmauri Rosenow, RN, CRRN, PPS Coordinator.  Information including medical coding and functional independence measure will be reviewed and updated through discharge.     Per nursing patient was given "Data Collection Information Summary for Patients in Inpatient Rehabilitation Facilities with attached "Privacy Act Statement-Health Care Records" upon admission.  

## 2015-03-21 ENCOUNTER — Inpatient Hospital Stay (HOSPITAL_COMMUNITY): Payer: Self-pay | Admitting: Physical Therapy

## 2015-03-21 ENCOUNTER — Inpatient Hospital Stay (HOSPITAL_COMMUNITY): Payer: Medicare Other | Admitting: Occupational Therapy

## 2015-03-21 ENCOUNTER — Inpatient Hospital Stay (HOSPITAL_COMMUNITY): Payer: Medicare Other

## 2015-03-21 DIAGNOSIS — I10 Essential (primary) hypertension: Secondary | ICD-10-CM | POA: Insufficient documentation

## 2015-03-21 MED ORDER — METHOCARBAMOL 750 MG PO TABS
750.0000 mg | ORAL_TABLET | Freq: Four times a day (QID) | ORAL | Status: DC | PRN
  Administered 2015-03-21 – 2015-03-24 (×4): 750 mg via ORAL
  Filled 2015-03-21 (×4): qty 1

## 2015-03-21 NOTE — Progress Notes (Signed)
Pt reporting increase in headaches, will order CT scan.

## 2015-03-21 NOTE — Progress Notes (Signed)
Physical Therapy Session Note  Patient Details  Name: Maxwell Carroll MRN: [id number] Date of Birth: 1945-08-21  Today's Date: 03/21/2015 PT Individual Time: 1015-1035 PT Individual Time Calculation (min): 20 min  and Today's Date: 03/21/2015 PT Missed Time: 25 Minutes Missed Time Reason: Pain  Short Term Goals: Week 1:  PT Short Term Goal 1 (Week 1): = LTGs due to anticipated LOS  Skilled Therapeutic Interventions/Progress Updates:    Pt received resting supine in bed with c/o 10/10 pain.  Pt with difficulty maintaining conversation with PT 2/2 pain.  Session focus on pain management and patient/family education (wife present at bedside).  PT provided ice for pt and assisted with positioned.  Pt noted to have increased AROM R cervical rotation during placement of ice.  Pt states he worked "really hard all day" on increased ROM in neck yesterday.  PT provided pt and wife with education on pain control, positioning, and use of ice 30 min on/30 min off for pain and inflammation control.  Both verbalized understanding.  RN notified of pt's pain and in to check on pt as PT exiting.  RN also states PA will visit pt to discuss further.  Pt left supine with needs in reach.    Therapy Documentation Precautions:  Precautions Precautions: Fall Restrictions Weight Bearing Restrictions: No General: PT Amount of Missed Time (min): 25 Minutes PT Missed Treatment Reason: Pain Pain: Pain Assessment Pain Assessment: 0-10 Pain Score: 10-Worst pain ever Pain Type: Acute pain Pain Location: Neck Pain Orientation: Posterior Pain Descriptors / Indicators: Throbbing;Aching Pain Onset: On-going Pain Intervention(s): RN made aware;Emotional support;Cold applied   See Function Navigator for Current Functional Status.   Therapy/Group: Individual Therapy  [name] 03/21/2015, 10:56 AM

## 2015-03-21 NOTE — Progress Notes (Signed)
Pt refuse NIV for the tonight. Pt is stable at this time.

## 2015-03-21 NOTE — Progress Notes (Signed)
Occupational Therapy Session Note  Patient Details  Name: MURPHY MARG MRN: [id number] Date of Birth: 1945/04/21  Today's Date: 03/21/2015 OT Individual Time: [id number] OT Individual Time Calculation (min): 49 min  11 missed minutes secondary to pain.   Short Term Goals: Week 1:  OT Short Term Goal 1 (Week 1): Pt. will perform grooming in standing for 5 minutes at sink. OT Short Term Goal 2 (Week 1): Pt. will engage in bathing with SBA OT Short Term Goal 3 (Week 1): Pt will engage in dressing at [place] with sit to stand for pants OT Short Term Goal 4 (Week 1): Pt will transfer to toilet with SBA OT Short Term Goal 5 (Week 1): Pt. will transfer to tub shower with min assist  Skilled Therapeutic Interventions/Progress Updates:  Upon entering the room, pt supine in bed with 8/10 c/o pain described as headache. Pt received pain medication prior to therapist arrival and agreeable to OT intervention. Pt ambulating without use of AD to dresser for squat in order to obtain clothing items with steady assist. Pt continuing to ambulate to bathroom with steady assist and wash from shower level. Pt engaged in sit <>stand from bench with steady assist in order to wash buttocks. Pt ambulating with steady assist to sit on EOB to don clothing items. Pt reports 10/10 pain and stating, "I have to tap out. My head is hurting so bad and I can't do this anymore. " OT assisted with positioning pt in bed for comfort and pt remained resting in bed with call bell and all needed items within reach upon exiting the room.   Therapy Documentation Precautions:  Precautions Precautions: Fall Restrictions Weight Bearing Restrictions: No General:   Vital Signs: Therapy Vitals Temp: 98.7 F (37.1 C) Temp Source: Oral Pulse Rate: 63 Resp: 19 BP: (!) 159/86 mmHg Patient Position (if appropriate): Lying Oxygen Therapy SpO2: 93 % O2 Device: Not Delivered Pain: Pain Assessment Pain Assessment: 0-10 Pain Score:  10 Pain Type: Acute pain Pain Location: Head Pain Descriptors / Indicators: Aching;Throbbing Pain Onset: On-going Pain Intervention(s): Medication (See eMAR)  See Function Navigator for Current Functional Status.   Therapy/Group: Individual Therapy  Phineas Semen 03/21/2015, 9:10 AM

## 2015-03-21 NOTE — Progress Notes (Addendum)
[place] PHYSICAL MEDICINE & REHABILITATION     PROGRESS NOTE  Subjective/Complaints: Patient sitting up in bed eating breakfast this morning. He states that his headaches are better than yesterday. He also states he has been working on his ataxia.  ROS: +Dizziness, ataxia, headaches.  Denies nausea, diplopia, CP, SOB, N/V/D   Objective: Vital Signs: Blood pressure 159/86, pulse 63, temperature 98.7 F (37.1 C), temperature source Oral, resp. rate 19, SpO2 93 %. No results found.  Recent Labs  03/20/15 0800  WBC 10.9*  HGB 15.3  HCT 44.8  PLT 313    Recent Labs  03/20/15 0800  NA 137  K 3.6  CL 102  GLUCOSE 138*  BUN 20  CREATININE 0.95  CALCIUM 9.2   CBG (last 3)  No results for input(s): GLUCAP in the last 72 hours.  Wt Readings from Last 3 Encounters:  03/15/15 140.4 kg (309 lb 8.4 oz)  03/08/15 137.667 kg (303 lb 8 oz)  03/06/15 142.883 kg (315 lb)    Physical Exam:  Constitutional: He appears well-developed. Appears fatigued  HENT:  normocephalic. Bilateral external ears within normal limits Eyes: EOM and conjunctiva are normal. Cardiovascular: Normal rate and regular rhythm. no murmur Respiratory: Effort normal and breath sounds normal. No respiratory distress.  GI: Soft. Bowel sounds are normal. He exhibits no distension.  Neurological:  Makes good eye contact with examiner.  Follow simple commands.  Motor: B/L UE: 4/5 proximal distal with bilateral ataxia (improving), no dysmetria  B/l LE 4+/5 proximal to distal  Skin:  Surgical sight C/D/I. Otherwise warm and dry. Psych: alert and appropriate  Assessment/Plan: 1. Balance/gait deficits secondary to left vestibular mass which require 3+ hours per day of interdisciplinary therapy in a comprehensive inpatient rehab setting. Physiatrist is providing close team supervision and 24 hour management of active medical problems listed below. Physiatrist and rehab team continue to assess barriers to  discharge/monitor patient progress toward functional and medical goals.  Function:  Bathing Bathing position Bathing activity did not occur: Refused Position: Shower  Bathing parts Body parts bathed by patient: Right arm, Left arm, Chest, Abdomen, Front perineal area, Buttocks, Right upper leg, Left upper leg, Right lower leg, Left lower leg Body parts bathed by helper: Back  Bathing assist Assist Level: Supervision or verbal cues      Upper Body Dressing/Undressing Upper body dressing   What is the patient wearing?: Pull over shirt/dress     Pull over shirt/dress - Perfomed by patient: Thread/unthread right sleeve, Thread/unthread left sleeve, Put head through opening, Pull shirt over trunk   Button up shirt - Perfomed by patient: Thread/unthread right sleeve, Thread/unthread left sleeve Button up shirt - Perfomed by helper: Pull shirt around back, Button/unbutton shirt    Upper body assist Assist Level: Touching or steadying assistance(Pt > 75%)      Lower Body Dressing/Undressing Lower body dressing   What is the patient wearing?: Underwear, Pants Underwear - Performed by patient: Thread/unthread right underwear leg, Thread/unthread left underwear leg, Pull underwear up/down Underwear - Performed by helper: Pull underwear up/down Pants- Performed by patient: Thread/unthread right pants leg, Thread/unthread left pants leg, Pull pants up/down Pants- Performed by helper: Pull pants up/down   Non-skid slipper socks- Performed by helper: Don/doff left sock, Don/doff right sock                  Lower body assist Assist for lower body dressing: Touching or steadying assistance (Pt > 75%)  Toileting Toileting Toileting activity did not occur: Refused Toileting steps completed by patient: Adjust clothing prior to toileting, Performs perineal hygiene, Adjust clothing after toileting   Toileting Assistive Devices: Grab bar or rail  Toileting assist Assist level: Touching  or steadying assistance (Pt.75%)   Transfers Chair/bed transfer   Chair/bed transfer method: Ambulatory Chair/bed transfer assist level: Supervision or verbal cues Chair/bed transfer assistive device: Armrests     Locomotion Ambulation     Max distance: 170 Assist level: Supervision or verbal cues   Wheelchair          Cognition Comprehension Comprehension assist level: Follows complex conversation/direction with no assist  Expression Expression assist level: Expresses complex ideas: With no assist  Social Interaction Social Interaction assist level: Interacts appropriately with others - No medications needed.  Problem Solving Problem solving assist level: Solves complex problems: With extra time  Memory Memory assist level: Recognizes or recalls 90% of the time/requires cueing < 10% of the time   Medical Problem List and Plan: 1. 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  -Cont to wean Decadron and d/c by end of week  -observe closely for persistence of pain and any visual sx----f/u CT if needed 2. DVT Prophylaxis/Anticoagulation: SCDs. Monitor for any signs of DVT.  -mobilize 3. Pain Management: will keep prn oxycodone on board for now 4. Mood: Prozac 40 mg daily. Team/family to provide emotional support 5. Neuropsych: This patient is capable of making decisions on his own behalf. 6. Skin/Wound Care: Routine skin checks. Continue staples for now 7. Fluids/Electrolytes/Nutrition: Routine I&O's. 8. Hypertension. Lisinopril 10 mg daily, increased to 20 on 2/20, will continue to monitor and consider further increase tomorrow.  9. Hyperlipidemia. Pravachol 10.BPH.Flomax 0.4 mg daily. 11. Neurogenic headaches    Continue Topamax 25, will consider increase on 2/23  Improved today  12. Leukocytosis (improving; pt on Decadron)   10.9 on 2/20  Will cont to monitor  LOS (Days) 4 A FACE TO FACE  EVALUATION WAS PERFORMED  Tarris Delbene Lorie Phenix 03/21/2015 8:06 AM

## 2015-03-21 NOTE — Progress Notes (Signed)
Physical Therapy Session Note  Patient Details  Name: Maxwell Carroll MRN: [id number] Date of Birth: May 28, 1945  Today's Date: 03/21/2015 PT Missed Time: 75 Minutes Missed Time Reason: Pain;CT/MRI   Pt remains in bed with ice applied to posterior neck, c/o no relief from pain since AM session.  Pt reports he is getting a CT this afternoon.  Pt provided emotional support to pt and left pt supine with call bell in reach and needs met.    Colm Lyford E Penven-Crew 03/21/2015, 1:21 PM

## 2015-03-21 NOTE — Progress Notes (Signed)
Occupational Therapy Note  Patient Details  Name: Maxwell Carroll MRN: [id number] Date of Birth: 11/03/1945  Today's Date: 03/21/2015 OT Missed Time: 9 Minutes Missed Time Reason: CT/MRI  Upon arriving at room, pt exiting with transport for CT. Pt with c/o increasing headache pain. 30 minutes of skilled OT intervention missed this session.    Phineas Semen 03/21/2015, 3:46 PM

## 2015-03-22 ENCOUNTER — Inpatient Hospital Stay (HOSPITAL_COMMUNITY): Payer: Medicare Other | Admitting: Occupational Therapy

## 2015-03-22 ENCOUNTER — Inpatient Hospital Stay (HOSPITAL_COMMUNITY): Payer: Medicare Other | Admitting: Speech Pathology

## 2015-03-22 ENCOUNTER — Inpatient Hospital Stay (HOSPITAL_COMMUNITY): Payer: Medicare Other | Admitting: Physical Therapy

## 2015-03-22 MED ORDER — ALPRAZOLAM 0.5 MG PO TABS
0.5000 mg | ORAL_TABLET | Freq: Three times a day (TID) | ORAL | Status: DC | PRN
  Administered 2015-03-22 – 2015-03-24 (×4): 0.5 mg via ORAL
  Filled 2015-03-22 (×4): qty 1

## 2015-03-22 MED ORDER — LIDOCAINE 5 % EX PTCH
1.0000 | MEDICATED_PATCH | CUTANEOUS | Status: DC
  Administered 2015-03-22 – 2015-03-24 (×3): 1 via TRANSDERMAL
  Filled 2015-03-22 (×3): qty 1

## 2015-03-22 MED ORDER — PROPRANOLOL HCL 10 MG PO TABS
10.0000 mg | ORAL_TABLET | Freq: Three times a day (TID) | ORAL | Status: DC
  Administered 2015-03-22 – 2015-03-24 (×6): 10 mg via ORAL
  Filled 2015-03-22 (×6): qty 1

## 2015-03-22 MED ORDER — PROPRANOLOL HCL 10 MG PO TABS: 10.0000 mg | ORAL_TABLET | Freq: Three times a day (TID) | ORAL | Status: DC

## 2015-03-22 MED ORDER — PRO-STAT SUGAR FREE PO LIQD
30.0000 mL | Freq: Every day | ORAL | Status: DC
  Filled 2015-03-22 (×2): qty 30

## 2015-03-22 NOTE — Progress Notes (Signed)
[facility] PHYSICAL MEDICINE & REHABILITATION     PROGRESS NOTE  Subjective/Complaints: Patient seen sitting in bed this morning with a wet towel over his head. He states his headaches are back mainly over the bifrontal area and nears posteriors incision. He denies visual changes and nausea.  ROS: +ataxia, headaches.  Denies nausea, diplopia, CP, SOB, N/V/D   Objective: Vital Signs: Blood pressure 121/67, pulse 74, temperature 98.8 F (37.1 C), temperature source Oral, resp. rate 18, SpO2 94 %. Ct Head Wo Contrast  03/21/2015  CLINICAL DATA:  Severe headaches, recent cerebellar surgery EXAM: CT HEAD WITHOUT CONTRAST TECHNIQUE: Contiguous axial images were obtained from the base of the skull through the vertex without intravenous contrast. COMPARISON:  03/16/2015 FINDINGS: Postoperative changes are noted in the occiput put consistent with the given clinical history. Some air is noted in the nondependent portion of the surgical bed. Fluid is again noted within the surgical bed. It measures approximately 4.0 by 2.2 cm. It is roughly stable from the prior exam. Fluid collection is noted posterior and inferior to the ox but consistent with postoperative change and stable from the recent MR. No acute hemorrhage is seen. No space-occupying mass lesion is noted. IMPRESSION: Postsurgical changes similar to that seen on recent MRI. No acute abnormality is noted. Electronically Signed   By: Inez Catalina M.D.   On: 03/21/2015 16:09    Recent Labs  03/20/15 0800  WBC 10.9*  HGB 15.3  HCT 44.8  PLT 313    Recent Labs  03/20/15 0800  NA 137  K 3.6  CL 102  GLUCOSE 138*  BUN 20  CREATININE 0.95  CALCIUM 9.2   CBG (last 3)  No results for input(s): GLUCAP in the last 72 hours.  Wt Readings from Last 3 Encounters:  03/15/15 140.4 kg (309 lb 8.4 oz)  03/08/15 137.667 kg (303 lb 8 oz)  03/06/15 142.883 kg (315 lb)    Physical Exam:  Constitutional: He appears well-developed and  well-nourished. Vital signs reviewed HENT:  normocephalic. Surgical scar posteriorly. Bilateral external ears within normal limits Eyes: EOM and conjunctiva are normal. Cardiovascular: Normal rate and regular rhythm. no murmur Respiratory: Effort normal and breath sounds normal. No respiratory distress.  GI: Soft. Bowel sounds are normal. He exhibits no distension.  Neurological: Alert Makes good eye contact with examiner.  Follow simple commands.  Motor: B/L UE: 4/5 proximal distal with bilateral ataxia (improving), no dysmetria  B/l LE 4+/5 proximal to distal  Skin:  Surgical sight C/D/I. Otherwise warm and dry. Psych: alert and appropriate. Normal behavior, normal affect  Assessment/Plan: 1. Balance/gait deficits secondary to left vestibular mass which require 3+ hours per day of interdisciplinary therapy in a comprehensive inpatient rehab setting. Physiatrist is providing close team supervision and 24 hour management of active medical problems listed below. Physiatrist and rehab team continue to assess barriers to discharge/monitor patient progress toward functional and medical goals.  Function:  Bathing Bathing position Bathing activity did not occur: Refused Position: Production manager parts bathed by patient: Right arm, Left arm, Chest, Abdomen, Front perineal area, Buttocks, Right upper leg, Left upper leg, Right lower leg, Left lower leg Body parts bathed by helper: Back  Bathing assist Assist Level: Touching or steadying assistance(Pt > 75%)      Upper Body Dressing/Undressing Upper body dressing   What is the patient wearing?: Pull over shirt/dress     Pull over shirt/dress - Perfomed by patient: Thread/unthread right sleeve, Thread/unthread left  sleeve, Put head through opening, Pull shirt over trunk   Button up shirt - Perfomed by patient: Thread/unthread right sleeve, Thread/unthread left sleeve Button up shirt - Perfomed by helper: Pull shirt around  back, Button/unbutton shirt    Upper body assist Assist Level: Supervision or verbal cues      Lower Body Dressing/Undressing Lower body dressing   What is the patient wearing?: Underwear, Pants Underwear - Performed by patient: Thread/unthread right underwear leg, Thread/unthread left underwear leg, Pull underwear up/down Underwear - Performed by helper: Pull underwear up/down Pants- Performed by patient: Thread/unthread right pants leg, Thread/unthread left pants leg, Pull pants up/down Pants- Performed by helper: Pull pants up/down   Non-skid slipper socks- Performed by helper: Don/doff left sock, Don/doff right sock                  Lower body assist Assist for lower body dressing: Touching or steadying assistance (Pt > 75%)      Toileting Toileting Toileting activity did not occur: Refused Toileting steps completed by patient: Performs perineal hygiene, Adjust clothing after toileting, Adjust clothing prior to toileting   Toileting Assistive Devices: Grab bar or rail  Toileting assist Assist level: Touching or steadying assistance (Pt.75%)   Transfers Chair/bed transfer   Chair/bed transfer method: Ambulatory Chair/bed transfer assist level: Touching or steadying assistance (Pt > 75%) Chair/bed transfer assistive device: Armrests     Locomotion Ambulation     Max distance: 170 Assist level: Supervision or verbal cues   Wheelchair          Cognition Comprehension Comprehension assist level: Follows complex conversation/direction with no assist  Expression Expression assist level: Expresses complex ideas: With no assist  Social Interaction Social Interaction assist level: Interacts appropriately with others - No medications needed.  Problem Solving Problem solving assist level: Solves complex problems: With extra time  Memory Memory assist level: Recognizes or recalls 90% of the time/requires cueing < 10% of the time   Medical Problem List and Plan: 1.  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  -Cont to wean Decadron further on 2/23  -Repeat CT head on 2/22, reviewed, only significant for postsurgical changes. 2. DVT Prophylaxis/Anticoagulation: SCDs. Monitor for any signs of DVT.  -mobilize 3. Pain Management: will keep prn oxycodone on board for now 4. Mood: Prozac 40 mg daily. Team/family to provide emotional support 5. Neuropsych: This patient is capable of making decisions on his own behalf. 6. Skin/Wound Care: Routine skin checks. Continue staples for now 7. Fluids/Electrolytes/Nutrition: Routine I&O's. 8. Hypertension. Lisinopril 10 mg daily, increased to 20 on 2/20.  Patient has persistent mild hypertension however pain and anxiety are likely a significant contributor to this.  9. Hyperlipidemia. Pravachol 10.BPH: Flomax 0.4 mg daily. 11. Neurogenic headaches    Continue Topamax 25, will consider increase on 2/23  Will add propranolol 10 today   Will add Lidoderm patch today as well 12. Leukocytosis (improving; pt on Decadron)   10.9 on 2/20  Will cont to monitor  LOS (Days) 5 A FACE TO FACE EVALUATION WAS PERFORMED  Samiha Denapoli Lorie Phenix 03/22/2015 10:30 AM

## 2015-03-22 NOTE — Progress Notes (Signed)
Nutrition Follow-up  DOCUMENTATION CODES:   Morbid obesity  INTERVENTION:  Continue Ensure Enlive po BID, each supplement provides 350 kcal and 20 grams of protein.  Provide 30 ml Prostat po once daily, each supplement provides 100 kcal and 15 grams of protein.   Provide nourishment snacks. (Ordered).  Encourage adequate PO intake.   NUTRITION DIAGNOSIS:   Inadequate oral intake related to poor appetite as evidenced by per patient/family report; improving  GOAL:   Patient will meet greater than or equal to 90% of their needs; progressing  MONITOR:   PO intake, Supplement acceptance, Weight trends, Labs, Skin, I & O's  REASON FOR ASSESSMENT:   Malnutrition Screening Tool    ASSESSMENT:   70 y.o. right handed male with history of hypertension, bilateral knee replacement, remote tobacco abuse. Patient lives with spouse independent prior to admission. One level home 5 steps to entry. Admitted 03/15/2015 with incidentally discovered posterior fossa cystic mass during workup of back pain in January. Underwent suboccipital craniectomy for tumor resection use of stereotactic navigation 03/15/2015 per Dr. Cyndy Freeze. Hospital course pain management. Currently on a clear liquid diet due to nausea and slowly advanced. Follow-up MRI of the brain 03/16/2015 negative for hydrocephalus or acute infarct. Decadron protocol as directed and taper over the next week. Physical and occupational therapy evaluations completed 03/16/2015 with recommendations of physical medicine rehabilitation consult. Patient was admitted for a comprehensive rehabilitation program.  Meal completion has been varied from 25-100%. Pt reports however his intake has been improving on a daily basis. Pt reports having a good appetite and ate well PTA at home. Pt currently has Ensure and Prostat ordered and has been consuming them. Pt requests nourishment snacks of fruits (prune, banana) as he has been having constipation. RD to  order. Pt was encouraged to eat his food at meals and to consume his supplements.   Pt with no observed significant fat or muscle mass loss.   Diet Order:  Diet regular Room service appropriate?: Yes; Fluid consistency:: Thin  Skin:  Wound (see comment) (Head incision from 2/15)  Last BM:  2/19  Height:   Ht Readings from Last 1 Encounters:  03/15/15 6\' 1"  (1.854 m)    Weight:   Wt Readings from Last 1 Encounters:  03/15/15 309 lb 8.4 oz (140.4 kg)    Ideal Body Weight:  83.64 kg (kg)  BMI:  There is no weight on file to calculate BMI.  Estimated Nutritional Needs:   Kcal:  2200-2400  Protein:  115-135 grams  Fluid:  >/= 2.2 L/day  EDUCATION NEEDS:   No education needs identified at this time  Corrin Parker, MS, RD, LDN Pager # [phone] After hours/ weekend pager # [phone]

## 2015-03-22 NOTE — Progress Notes (Signed)
Occupational Therapy Weekly Progress Note  Patient Details  Name: Maxwell Carroll MRN: [id number] Date of Birth: Jun 24, 1945  Beginning of progress report period: March 18, 2015 End of progress report period: March 22, 2015  Today's Date: 03/22/2015 OT Individual Time: 3762-8315 OT Individual Time Calculation (min): 68 min  7 missed minutes secondary to pain.   Patient has met 2 of 5 short term goals. Pt is making progress towards Occupational Therapy goals. Most recent barriers to progress have been extreme pain described as head ache which have limited pts ability for participation. However, pt is highly motivated to progress.  Patient continues to demonstrate the following deficits: decreased I with self care, decreased balance, decreased functional transfers, vision deficits, acute pain, and therefore will continue to benefit from skilled OT intervention to enhance overall performance with BADL and iADL.  Patient progressing toward long term goals..  Continue plan of care.  OT Short Term Goals Week 1:  OT Short Term Goal 1 (Week 1): Pt. will perform grooming in standing for 5 minutes at sink. OT Short Term Goal 1 - Progress (Week 1): Met OT Short Term Goal 2 (Week 1): Pt. will engage in bathing with SBA OT Short Term Goal 2 - Progress (Week 1): Progressing toward goal OT Short Term Goal 3 (Week 1): Pt will engage in dressing at [place] with sit to stand for pants OT Short Term Goal 3 - Progress (Week 1): Progressing toward goal OT Short Term Goal 4 (Week 1): Pt will transfer to toilet with SBA OT Short Term Goal 4 - Progress (Week 1): Progressing toward goal OT Short Term Goal 5 (Week 1): Pt. will transfer to tub shower with min assist OT Short Term Goal 5 - Progress (Week 1): Met Week 2:  OT Short Term Goal 1 (Week 2): STGs=LTGs secondary to upcoming discharge  Skilled Therapeutic Interventions/Progress Updates:  Upon entering the room, pt seated in recliner chair with wife  present in the room and 10/10 c/o pain described as headache and "screws going into my head". Pt very restless during session and able to verbalize to therapist that he is very anxious today. OT notifying RN for pain medication and concerns regarding anxiety. Pt agreeable to energy conservation education. Paper handouts provided for energy conservation for general principles, self care tasks, IADL tasks. OT educated pt on information and provided examples with pt being and active participate in discussion. Pt requesting to return to bed at end of session with steady hand held assist to bed. Mod A for sit >supine as he needed assistance for B LEs. Wife present in room and call bell and all needed items within reach upon exiting the room.   Therapy Documentation Precautions:  Precautions Precautions: Fall Restrictions Weight Bearing Restrictions: No General: General OT Amount of Missed Time: 7 Minutes Pain: Pain Assessment Pain Assessment: 0-10 Pain Score: 9  Pain Intervention(s): Medication (See eMAR)  See Function Navigator for Current Functional Status.   Therapy/Group: Individual Therapy  Maxwell Carroll 03/22/2015, 9:24 AM

## 2015-03-22 NOTE — Progress Notes (Signed)
Physical Therapy Session Note  Patient Details  Name: Maxwell Carroll MRN: [id number] Date of Birth: [birth year]  Today's Date: 03/22/2015 PT Individual Time: :[id number] PT Individual Time Calculation (min): 68 min   Short Term Goals: Week 1:  PT Short Term Goal 1 (Week 1): = LTGs due to anticipated LOS  Skilled Therapeutic Interventions/Progress Updates:    Pt received resting in bed, c/o 8/10 pain with little relief since yesterday.  Session focus on soft tissue mobilization, KT tape for pain control, bed mobility, and transfers.  Pt demonstrates rolling in bed using bedrails with supervision and verbal cues for weightbearing through LEs to help with roll.  PT performed soft tissue massage to L/R posterior neck (except area of incision), suboccipital muscles, temporal muscles, and shoulder.  Pt noted to have several areas of tenderness in upper trapezius, suboccipital muscles and at base of neck that were relieved with light tissue mobilization.  Pt transitioned supine>sitting EOB with steady assist for trunk control and verbal cues for technique.  PT applied KT tape on bilateral posterior neck.  Pt able to perform sit<>stand from EOB and amb with HHA to bathroom.  Pt seated on toilet and NT notified of pt position.  Wife present in room and agreeable to notify NT when pt ready to get back up.  Pt noted that his pain felt improved following soft tissue mobilization and KT tape application.    Therapy Documentation Precautions:  Precautions Precautions: Fall Restrictions Weight Bearing Restrictions: No Pain: Pain Assessment Pain Assessment: 0-10 Pain Score: 8  Pain Location: Neck Pain Orientation: Posterior Pain Intervention(s): Massage;Emotional support;RN made aware   See Function Navigator for Current Functional Status.   Therapy/Group: Individual Therapy  [name] 03/22/2015, 12:51 PM

## 2015-03-22 NOTE — Progress Notes (Signed)
Speech Language Pathology Daily Session Note  Patient Details  Name: Maxwell Carroll MRN: [id number] Date of Birth: [birth year]  Today's Date: 03/22/2015 SLP Individual Time: 1350-1430 SLP Individual Time Calculation (min): 40 min  Short Term Goals: Week 1: SLP Short Term Goal 1 (Week 1): Pt will complete semi-complex home management tasks for >80% accuracy with mod I.    Skilled Therapeutic Interventions: Skilled treatment session focused on cognitive goals. Upon arrival, patient was asleep while supine in bed. SLP facilitated session by providing supervision question cues for recall of events from previous therapy session and for verbal problem solving in regards to current concerns about pain, constipation, etc. Patient's overall processing speed and cognitive function appears to be improving daily. Follow-up X 1 more session to ensure patient is at his cognitive baseline prior to discharge from SLP caseload.    Function:   Cognition Comprehension Comprehension assist level: Follows complex conversation/direction with no assist  Expression   Expression assist level: Expresses complex ideas: With no assist  Social Interaction Social Interaction assist level: Interacts appropriately with others - No medications needed.  Problem Solving Problem solving assist level: Solves complex problems: With extra time  Memory Memory assist level: Recognizes or recalls 90% of the time/requires cueing < 10% of the time    Pain Patient reports his headache is better but still reports pain. Patient pre-medicated   Therapy/Group: Individual Therapy  Havana Baldwin 03/22/2015, 6:14 PM

## 2015-03-23 ENCOUNTER — Inpatient Hospital Stay (HOSPITAL_COMMUNITY): Payer: Self-pay | Admitting: Physical Therapy

## 2015-03-23 ENCOUNTER — Inpatient Hospital Stay (HOSPITAL_COMMUNITY): Payer: Medicare Other

## 2015-03-23 ENCOUNTER — Inpatient Hospital Stay (HOSPITAL_COMMUNITY): Payer: Medicare Other | Admitting: Occupational Therapy

## 2015-03-23 MED ORDER — DEXAMETHASONE 2 MG PO TABS
2.0000 mg | ORAL_TABLET | Freq: Every day | ORAL | Status: DC
  Administered 2015-03-24: 2 mg via ORAL
  Filled 2015-03-23: qty 1

## 2015-03-23 NOTE — Plan of Care (Signed)
Problem: RH Balance Goal: LTG Patient will maintain dynamic standing with ADLs (OT) LTG: Patient will maintain dynamic standing balance with assist during activities of daily living (OT)  Downgraded secondary to pt's progress  Problem: RH Grooming Goal: LTG Patient will perform grooming w/assist,cues/equip (OT) LTG: Patient will perform grooming with assist, with/without cues using equipment (OT)  Downgraded secondary to pt's progress  Problem: RH Bathing Goal: LTG Patient will bathe with assist, cues/equipment (OT) LTG: Patient will bathe specified number of body parts with assist with/without cues using equipment (position) (OT)  Downgraded secondary to pt's progress  Problem: RH Dressing Goal: LTG Patient will perform upper body dressing (OT) LTG Patient will perform upper body dressing with assist, with/without cues (OT).  Downgraded secondary to pt's progress Goal: LTG Patient will perform lower body dressing w/assist (OT) LTG: Patient will perform lower body dressing with assist, with/without cues in positioning using equipment (OT)  Downgraded secondary to pt's progress  Problem: RH Toileting Goal: LTG Patient will perform toileting w/assist, cues/equip (OT) LTG: Patient will perform toiletiing (clothes management/hygiene) with assist, with/without cues using equipment (OT)  Downgraded secondary to pt's progress  Problem: RH Simple Meal Prep Goal: LTG Patient will perform simple meal prep w/assist (OT) LTG: Patient will perform simple meal prep with assistance, with/without cues (OT).  Downgraded secondary to pt's progress  Problem: RH Laundry Goal: LTG Patient will perform laundry w/assist, cues (OT) LTG: Patient will perform laundry with assistance, with/without cues (OT).  Downgraded secondary to pt's progress  Problem: RH Light Housekeeping Goal: LTG Patient will perform light housekeeping w/assist (OT) LTG: Patient will perform light housekeeping with assistance,  with/without cues (OT).  Downgraded secondary to pt's progress  Problem: RH Toilet Transfers Goal: LTG Patient will perform toilet transfers w/assist (OT) LTG: Patient will perform toilet transfers with assist, with/without cues using equipment (OT)  Downgraded secondary to pt's progress

## 2015-03-23 NOTE — Progress Notes (Signed)
Occupational Therapy Session Note  Patient Details  Name: Maxwell Carroll MRN: [id number] Date of Birth: 1945-03-28  Today's Date: 03/23/2015 OT Individual Time: [id number] OT Individual Time Calculation (min): 44 min    Short Term Goals: Week 2:  OT Short Term Goal 1 (Week 2): STGs=LTGs secondary to upcoming discharge  Skilled Therapeutic Interventions/Progress Updates:  Upon entering the room, pt supine in bed in bed with 8/10 c/o pain described as head ache. Pt able to recall previous visit and described energy conservation techniques utilized in session. Pt very fearful and anxious this session. He reports that he is unable to sit up on EOB or in chair because he will fall. OT provided encouragement and coaxing for task. Pt performed supine >sit with min A. Mod A for sit >stand from bed height. Pt ambulating 3' with hand held assist and shuffling gait pattern into wheelchair. His wife asked to speak to therapist outside of room regarding concerns she had with pt progress and was very emotional. OT alerted RN and PA of events in session.  PA entered room as therapist exiting to discuss concerns with family. All needed items within reach.   Therapy Documentation Precautions:  Precautions Precautions: Fall Restrictions Weight Bearing Restrictions: No  See Function Navigator for Current Functional Status.   Therapy/Group: Individual Therapy  Phineas Semen 03/23/2015, 12:21 PM

## 2015-03-23 NOTE — Progress Notes (Signed)
Physical Therapy Session Note  Patient Details  Name: Maxwell Carroll MRN: [id number] Date of Birth: [birth year]  Today's Date: 03/23/2015 PT Individual Time: 5597-4163 and 1400-1433 PT Individual Time Calculation (min): 56 min and 33 min   Short Term Goals: Week 1:  PT Short Term Goal 1 (Week 1): = LTGs due to anticipated LOS  Skilled Therapeutic Interventions/Progress Updates:    Session 1: Pt received resting in recliner, reporting pain is better than yesterday and agreeable to therapy.  Sit>stand from recliner with supervision and RW and pt amb to therapy gym with RW (pt request to use AD).  Pt seated edge of mat and therapist reapplied KT tape to posterior neck.  PT instructed pt in AROM for neck within pain tolerance rotation, side bends, and flex/ext.  Pt engaged in static standing task (mod assist to stand) completing pipe tree x3 minutes before reporting "I need to sit down I'm getting more confused."  Pt with increased respiratory rate and rapid eye movements following sitting on edge of mat, pt positioned in L side lying with wet cloth to side of face due to pt reports of nausea.  W/C obtained and pt performed supine>sit with mod assist and stand/pivot to w/c with steady assist.  Pt returned to room and performed amb transfer back to bed with steady assist and verbal cues.  Mod assist to come to supine in bed.  Pt reports distress that he didn't do anything today, when PT reminded him that he walked all the way to the gym pt states "no I didn't, you rolled me to the gym in the wheelchair." PT reoriented pt to earlier ambulation and pt verbalized remembering.  Pt positioned with call bell in reach and needs met.  RN notified of pt's confusion and memory lapse.    Session 2: Pt received sleeping in bed, reporting feeling better and agreeable to therapy session.  Supine>sit with more than a reasonable amount of time, using bed rails and HOB elevated, and supervision.  Stand/pivot from EOB>w/c with  supervision and more than a reasonable amount of time with multiple times repeating instructions to stand.  PT instructed pt in car transfer at simulated small SUV height with close supervision>steady assist and max verbal cues for safe sequencing.  Pt returned to room in w/c and performed toileting with steady assist to/from toilet and supervision/mod I for clothing management and hygiene.  Pt returned to recliner with HHA and positioned with call bell in reach and needs met.   Therapy Documentation Precautions:  Precautions Precautions: Fall Restrictions Weight Bearing Restrictions: No   See Function Navigator for Current Functional Status.   Therapy/Group: Individual Therapy  [name] 03/23/2015, 12:26 PM

## 2015-03-23 NOTE — Patient Care Conference (Signed)
Inpatient RehabilitationTeam Conference and Plan of Care Update Date: 03/22/2015   Time: 2:35 PM    Patient Name: Maxwell Carroll      Medical Record Number: [id number]  Date of Birth: 1945-08-14 Sex: Male         Room/Bed: 4W08C/4W08C-01 Payor Info: Payor: [employer] MEDICARE / Plan: [facility] MEDICARE / Product Type: *No Product type* /    Admitting Diagnosis: Tumor Resection  Admit Date/Time:  03/17/2015  6:32 PM Admission Comments: No comment available   Primary Diagnosis:  Cerebellar mass Principal Problem: Cerebellar mass  Patient Active Problem List   Diagnosis Date Noted  . Benign essential HTN   . Other complicated headache syndrome   . Leukocytosis   . Essential hypertension   . Ataxia 03/17/2015  . Cerebellar mass 03/15/2015    Expected Discharge Date: Expected Discharge Date: 03/01/15  Team Members Present: Physician leading conference: Dr. 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 with hypertension and intractable headaches  Improve headaches and BP  See above   Bowel/Bladder   Cont of bowel and bladder, LBM 2/19  Remain cont of bowel and bladder  Manage bowel with medications with min asst   Swallow/Nutrition/ Hydration             ADL's   supervision - steady assist for all self care and functional transfers  Mod I overall with supervision for shower transfer  pt/family edu, balance, functional transfers, self care retraining   Mobility   supervision all mobility  mod I  ROM, balance, gait   Communication             Safety/Cognition/ Behavioral Observations  Supervision-Mod I  Mod I  complex problem solving,  education    Pain   Increased levels of pain today, 10/10 consistently without relief of pain with OXY IR and ice. Has been adressed by Linna Hoff PA, pending CT scan  Pain level less than 5  Assess pain q shift and PRN, medicate appropiately    Skin   Posterior incsison open to air, clean, dry, and intact; No skin breakdown noted  Skin to remain free from breakdown while on the Rehab unit  Assess skin q shift and PRN    Rehab Goals Patient on target to meet rehab goals: Yes *See Care Plan and progress notes for long and short-term goals.  Barriers to Discharge: Headaches, hypertension, safety concenrs,     Possible Resolutions to Barriers:  Cont therapies, adjust HA meds, address HTN/anxiety    Discharge Planning/Teaching Needs:  Plan to d/c home with wife who can provide intermittent assistance  to be scheduled   Team Discussion:  Still weaning sterouds;  HA continues;  Some med changes today to address this.  Very difficulty to determine extent of true cognitive impairments as pain appears to affect presentation.  ST likely to d/c end of week.  Reaching targeted goals.  Recommend OP tx if can get to facility.  Revisions to Treatment Plan:  None   Continued Need for Acute Rehabilitation Level of Care: The patient requires daily medical management by a physician with specialized training in physical medicine and rehabilitation for the following conditions:  Daily direction of a multidisciplinary physical rehabilitation program to ensure safe treatment while eliciting the highest outcome that is of practical value to the patient.: Yes Daily medical management of patient stability for increased activity during participation in an intensive rehabilitation regime.: Yes Daily analysis of laboratory values and/or radiology reports with any subsequent need for medication adjustment of medical intervention for : Post surgical problems;Neurological problems;Blood pressure problems  Dyane Broberg,  [place] 03/23/2015, 9:56 AM

## 2015-03-23 NOTE — Progress Notes (Signed)
[facility] PHYSICAL MEDICINE & REHABILITATION     PROGRESS NOTE  Subjective/Complaints: Pt seen laying in bed this AM.  He notes he had a great night and a "heavenly" BM this AM.  He is amazed at how a small medication can make such a big difference.    ROS:  Denies nausea, diplopia, CP, SOB, N/V/D   Objective: Vital Signs: Blood pressure 115/76, pulse 90, temperature 97.6 F (36.4 C), temperature source Oral, resp. rate 18, weight 132.269 kg (291 lb 9.6 oz), SpO2 97 %. Ct Head Wo Contrast  03/21/2015  CLINICAL DATA:  Severe headaches, recent cerebellar surgery EXAM: CT HEAD WITHOUT CONTRAST TECHNIQUE: Contiguous axial images were obtained from the base of the skull through the vertex without intravenous contrast. COMPARISON:  03/16/2015 FINDINGS: Postoperative changes are noted in the occiput put consistent with the given clinical history. Some air is noted in the nondependent portion of the surgical bed. Fluid is again noted within the surgical bed. It measures approximately 4.0 by 2.2 cm. It is roughly stable from the prior exam. Fluid collection is noted posterior and inferior to the ox but consistent with postoperative change and stable from the recent MR. No acute hemorrhage is seen. No space-occupying mass lesion is noted. IMPRESSION: Postsurgical changes similar to that seen on recent MRI. No acute abnormality is noted. Electronically Signed   By: Inez Catalina M.D.   On: 03/21/2015 16:09   No results for input(s): WBC, HGB, HCT, PLT in the last 72 hours. No results for input(s): NA, K, CL, GLUCOSE, BUN, CREATININE, CALCIUM in the last 72 hours.  Invalid input(s): CO CBG (last 3)  No results for input(s): GLUCAP in the last 72 hours.  Wt Readings from Last 3 Encounters:  03/22/15 132.269 kg (291 lb 9.6 oz)  03/15/15 140.4 kg (309 lb 8.4 oz)  03/08/15 137.667 kg (303 lb 8 oz)    Physical Exam:  Constitutional: He appears well-developed and well-nourished. Vital signs  reviewed HENT:  normocephalic. Surgical scar posteriorly. Bilateral external ears within normal limits Eyes: EOM and conjunctiva are normal. Cardiovascular: Normal rate and regular rhythm. no murmur Respiratory: Effort normal and breath sounds normal. No respiratory distress.  GI: Soft. Bowel sounds are normal. He exhibits no distension.  Neurological: Alert Makes good eye contact with examiner.  Follow simple commands.  Motor: B/L UE: 4+/5 proximal distal with bilateral ataxia (improving), no dysmetria  B/l LE 4+/5 proximal to distal  Skin:  Surgical sight C/D/I. Otherwise warm and dry. Psych: alert and appropriate. Normal behavior, normal affect  Assessment/Plan: 1. Balance/gait deficits secondary to left vestibular mass which require 3+ hours per day of interdisciplinary therapy in a comprehensive inpatient rehab setting. Physiatrist is providing close team supervision and 24 hour management of active medical problems listed below. Physiatrist and rehab team continue to assess barriers to discharge/monitor patient progress toward functional and medical goals.  Function:  Bathing Bathing position Bathing activity did not occur: Refused Position: Production manager parts bathed by patient: Right arm, Left arm, Chest, Abdomen, Front perineal area, Buttocks, Right upper leg, Left upper leg, Right lower leg, Left lower leg Body parts bathed by helper: Back  Bathing assist Assist Level: Touching or steadying assistance(Pt > 75%)      Upper Body Dressing/Undressing Upper body dressing   What is the patient wearing?: Pull over shirt/dress     Pull over shirt/dress - Perfomed by patient: Thread/unthread right sleeve, Thread/unthread left sleeve, Put head through opening, Pull  shirt over trunk   Button up shirt - Perfomed by patient: Thread/unthread right sleeve, Thread/unthread left sleeve Button up shirt - Perfomed by helper: Pull shirt around back, Button/unbutton shirt     Upper body assist Assist Level: Supervision or verbal cues      Lower Body Dressing/Undressing Lower body dressing   What is the patient wearing?: Underwear, Pants Underwear - Performed by patient: Thread/unthread right underwear leg, Thread/unthread left underwear leg, Pull underwear up/down Underwear - Performed by helper: Pull underwear up/down Pants- Performed by patient: Thread/unthread right pants leg, Thread/unthread left pants leg, Pull pants up/down Pants- Performed by helper: Pull pants up/down   Non-skid slipper socks- Performed by helper: Don/doff left sock, Don/doff right sock                  Lower body assist Assist for lower body dressing: Touching or steadying assistance (Pt > 75%)      Toileting Toileting Toileting activity did not occur: Refused Toileting steps completed by patient: Adjust clothing prior to toileting, Performs perineal hygiene, Adjust clothing after toileting   Toileting Assistive Devices: Grab bar or rail  Toileting assist Assist level: Touching or steadying assistance (Pt.75%)   Transfers Chair/bed transfer   Chair/bed transfer method: Ambulatory Chair/bed transfer assist level: Touching or steadying assistance (Pt > 75%) Chair/bed transfer assistive device: Armrests     Locomotion Ambulation     Max distance: 10 Assist level: Touching or steadying assistance (Pt > 75%)   Wheelchair          Cognition Comprehension Comprehension assist level: Follows complex conversation/direction with no assist  Expression Expression assist level: Expresses complex ideas: With no assist  Social Interaction Social Interaction assist level: Interacts appropriately with others - No medications needed.  Problem Solving Problem solving assist level: Solves complex problems: With extra time  Memory Memory assist level: Recognizes or recalls 90% of the time/requires cueing < 10% of the time   Medical Problem List and Plan: 1. Cerebellar ataxia  with gait disorder secondary to left cerebellar mass status post resection.  -Decadron decreased, will d/c 2/27  -Repeat CT head on 2/22, reviewed, only significant for postsurgical changes. 2. DVT Prophylaxis/Anticoagulation: SCDs. Monitor for any signs of DVT.  -mobilize 3. Pain Management: Cont prn oxycodone  4. Mood: Prozac 40 mg daily. Team/family to provide emotional support 5. Neuropsych: This patient is capable of making decisions on his own behalf. 6. Skin/Wound Care: Routine skin checks. Continue staples for now 7. Fluids/Electrolytes/Nutrition: Routine I&O's. 8. Hypertension. Lisinopril 10 mg daily, increased to 20 on 2/20.  BP improved with improvement in pain.  9. Hyperlipidemia. Pravachol 10.BPH: Flomax 0.4 mg daily. 11. Neurogenic headaches    Continue Topamax 25, will cont current dose due to improvements in headaches  Cont propranolol 10   Cont Lidoderm patch 12. Leukocytosis (improving; pt on Decadron)   10.9 on 2/20  Will cont to monitor  LOS (Days) 6 A FACE TO FACE EVALUATION WAS PERFORMED  Boyd [place] Lorie Phenix 03/23/2015 8:41 AM

## 2015-03-23 NOTE — Progress Notes (Signed)
Occupational Therapy Session Note  Patient Details  Name: Maxwell Carroll MRN: [id number] Date of Birth: 1945-02-05  Today's Date: 03/23/2015 OT Individual Time: 0745-0900 OT Individual Time Calculation (min): 75 min    Short Term Goals: Week 2:  OT Short Term Goal 1 (Week 2): STGs=LTGs secondary to upcoming discharge  Skilled Therapeutic Interventions/Progress Updates:    Pt resting in bed upon arrival and agreeable to engaging in BADL retraining including bathing at shower level and dressing with sit<>stand from recliner.  Pt amb with HHA to bathroom and transferred to shower seat.  Pt required more than a reasonable amount of time to complete bathing tasks without assistance.  Pt required assistance donning socks this morning.  Pt required multiple rest breaks throughout session and requested to return to bed at end of session because he was "exhausted." Pt maintained head in midline position secondary to consistent headache/pain with increased pain with head movements. Focus on activity tolerance, sit<>stand, standing balance, functional amb with HHA, and safety awareness to increase independence with BADLs.   Therapy Documentation Precautions:  Precautions Precautions: Fall Restrictions Weight Bearing Restrictions: No Pain: Pain Assessment Pain Assessment: 0-10 Pain Score: 6  Pain Intervention(s):RN aware, repositioned  See Function Navigator for Current Functional Status.   Therapy/Group: Individual Therapy  Leroy Libman 03/23/2015, 9:06 AM

## 2015-03-23 NOTE — Progress Notes (Signed)
Social Work Patient ID: Maxwell Carroll, male   DOB: 1945/05/31, 70 y.o.   MRN: [id number]  Met with pt and wife this afternoon to review team conference info.  They are aware of targeted d/c date 3/1 with mod ind goals, however, they are both expressing concerns about his cognition which they feel has worsened over the past couple of days.  Had spoken earlier with Marlowe Shores, PA about additional concerns and are both admitting feeling anxious and stressed with anticipated d/c next week.  Wife also talking about their financial stressors and appears very concerned which also stresses pt.  Will monitor progress closely to make sure we are meeting goals.  PA also to reach out to neurosurgeon.  Continue to follow.  Trinity Hyland, LCSW

## 2015-03-24 ENCOUNTER — Inpatient Hospital Stay (HOSPITAL_COMMUNITY): Payer: Medicare Other

## 2015-03-24 ENCOUNTER — Inpatient Hospital Stay (HOSPITAL_COMMUNITY): Payer: Medicare Other | Admitting: Speech Pathology

## 2015-03-24 ENCOUNTER — Inpatient Hospital Stay (HOSPITAL_COMMUNITY): Payer: Medicare Other | Admitting: Occupational Therapy

## 2015-03-24 ENCOUNTER — Inpatient Hospital Stay (HOSPITAL_COMMUNITY): Payer: Self-pay | Admitting: Physical Therapy

## 2015-03-24 DIAGNOSIS — R062 Wheezing: Secondary | ICD-10-CM

## 2015-03-24 LAB — CBC
HEMATOCRIT: 46 % (ref 39.0–52.0)
HEMOGLOBIN: 15.7 g/dL (ref 13.0–17.0)
MCH: 30.3 pg (ref 26.0–34.0)
MCHC: 34.1 g/dL (ref 30.0–36.0)
MCV: 88.6 fL (ref 78.0–100.0)
Platelets: 261 10*3/uL (ref 150–400)
RBC: 5.19 MIL/uL (ref 4.22–5.81)
RDW: 12.5 % (ref 11.5–15.5)
WBC: 8 10*3/uL (ref 4.0–10.5)

## 2015-03-24 LAB — BASIC METABOLIC PANEL
ANION GAP: 11 (ref 5–15)
BUN: 30 mg/dL — ABNORMAL HIGH (ref 6–20)
CHLORIDE: 100 mmol/L — AB (ref 101–111)
CO2: 20 mmol/L — AB (ref 22–32)
Calcium: 8.7 mg/dL — ABNORMAL LOW (ref 8.9–10.3)
Creatinine, Ser: 1.19 mg/dL (ref 0.61–1.24)
GFR calc Af Amer: >60 mL/min (ref >60)
GLUCOSE: 121 mg/dL — AB (ref 65–99)
POTASSIUM: 4.9 mmol/L (ref 3.5–5.1)
Sodium: 131 mmol/L — ABNORMAL LOW (ref 135–145)

## 2015-03-24 MED ORDER — PANTOPRAZOLE SODIUM 40 MG PO TBEC: 40.0000 mg | DELAYED_RELEASE_TABLET | Freq: Every day | ORAL | Status: AC

## 2015-03-24 MED ORDER — PROPRANOLOL HCL 20 MG PO TABS: 20.0000 mg | ORAL_TABLET | Freq: Three times a day (TID) | ORAL | Status: AC

## 2015-03-24 MED ORDER — FLUOXETINE HCL 40 MG PO CAPS: 40.0000 mg | ORAL_CAPSULE | Freq: Every day | ORAL | Status: AC

## 2015-03-24 MED ORDER — DEXAMETHASONE 2 MG PO TABS: 2.0000 mg | ORAL_TABLET | Freq: Every day | ORAL | Status: AC

## 2015-03-24 MED ORDER — OXYCODONE HCL 5 MG PO TABS: 5.0000 mg | ORAL_TABLET | ORAL | Status: AC | PRN

## 2015-03-24 MED ORDER — PROPRANOLOL HCL 20 MG PO TABS
20.0000 mg | ORAL_TABLET | Freq: Three times a day (TID) | ORAL | Status: DC
  Administered 2015-03-24: 20 mg via ORAL
  Filled 2015-03-24: qty 1

## 2015-03-24 MED ORDER — METHOCARBAMOL 750 MG PO TABS: 750.0000 mg | ORAL_TABLET | Freq: Four times a day (QID) | ORAL | Status: AC | PRN

## 2015-03-24 MED ORDER — TOPIRAMATE 25 MG PO TABS: 25.0000 mg | ORAL_TABLET | Freq: Every day | ORAL | Status: AC

## 2015-03-24 MED ORDER — LIDOCAINE 5 % EX PTCH: 1.0000 | MEDICATED_PATCH | CUTANEOUS | Status: AC

## 2015-03-24 MED ORDER — ALPRAZOLAM 0.5 MG PO TABS: 0.5000 mg | ORAL_TABLET | Freq: Three times a day (TID) | ORAL | Status: AC | PRN

## 2015-03-24 MED ORDER — LISINOPRIL 20 MG PO TABS: 20.0000 mg | ORAL_TABLET | Freq: Every day | ORAL | Status: AC

## 2015-03-24 MED ORDER — TAMSULOSIN HCL 0.4 MG PO CAPS: 0.4000 mg | ORAL_CAPSULE | Freq: Every day | ORAL | Status: AC

## 2015-03-24 NOTE — Discharge Summary (Signed)
Discharge summary job [id number]

## 2015-03-24 NOTE — Progress Notes (Signed)
Occupational Therapy Session Note  Patient Details  Name: Maxwell Carroll MRN: [id number] Date of Birth: 03/19/1945  Today's Date: 03/24/2015 OT Individual Time:  - 1000-1100   (60 min)      Short Term Goals: Week 1:  OT Short Term Goal 1 (Week 1): Pt. will perform grooming in standing for 5 minutes at sink. OT Short Term Goal 1 - Progress (Week 1): Met OT Short Term Goal 2 (Week 1): Pt. will engage in bathing with SBA OT Short Term Goal 2 - Progress (Week 1): Progressing toward goal OT Short Term Goal 3 (Week 1): Pt will engage in dressing at [place] with sit to stand for pants OT Short Term Goal 3 - Progress (Week 1): Progressing toward goal OT Short Term Goal 4 (Week 1): Pt will transfer to toilet with SBA OT Short Term Goal 4 - Progress (Week 1): Progressing toward goal OT Short Term Goal 5 (Week 1): Pt. will transfer to tub shower with min assist OT Short Term Goal 5 - Progress (Week 1): Met Week 2:  OT Short Term Goal 1 (Week 2): STGs=LTGs secondary to upcoming discharge  Skilled Therapeutic Interventions/Progress Updates:    Pt resting in bed upon arrival and agreeable to engaging in BADL retraining including bathing at shower level and dressing with sit<>stand from shower bench.  . Pt reported he use to be fearless but is not anymore due to age.  Pt amb with HHA to bathroom and transferred to shower seat. Pt required more than a reasonable amount of time to complete bathing tasks without assistance.  Pt required multiple rest breaks throughout session and requested to return to bed at end of session because he was "exhausted." . Focus on activity tolerance, sit<>stand, standing balance, functional amb with HHA, and safety awareness to increase independence with BADLs.    Therapy Documentation Precautions:  Precautions Precautions: Fall Restrictions Weight Bearing Restrictions: No     Pain:  7/10  back             See Function Navigator for Current Functional  Status.   Therapy/Group: Individual Therapy  Lisa Roca 03/24/2015, 7:57 AM

## 2015-03-24 NOTE — Progress Notes (Signed)
Speech Language Pathology Weekly Progress and Session Note  Patient Details  Name: Maxwell Carroll MRN: [id number] Date of Birth: March 05, 1945  Beginning of progress report period: March 17, 2015 End of progress report period: March 24, 2015  Today's Date: 03/24/2015 SLP Individual Time: 7591-6384 SLP Individual Time Calculation (min): 55 min  Short Term Goals: Week 1: SLP Short Term Goal 1 (Week 1): Pt will complete semi-complex home management tasks for >80% accuracy with mod I.   SLP Short Term Goal 1 - Progress (Week 1): Not met    New Short Term Goals: Week 2: SLP Short Term Goal 1 (Week 2): Patient will demonstrate sustained attention to a task for 30 minutes with Min A verbal cues for redirection.  SLP Short Term Goal 2 (Week 2): Patient will complete functional and familiar tasks with Min A verbal and visual cues for problem solving.   SLP Short Term Goal 3 (Week 2): Patient will recall new, daily information with Min A verbal and visual cues.   Weekly Progress Updates: Patient has made minimal and inconsistent gains and has met 0 of 1 STG's this reporting period. Currently, patient requires overall Min-Mod A multimodal cues to complete functional and familiar tasks safely due to decreased attention, recall and functional problem solving. Patient continues to be limited by pain, restlessness and anxiety and question how much medications may be impacting patient's overall cognitive function. Patient/Family education ongoing. Patient would benefit from continued skilled SLP intervention to maximize cognitive function and overall functional independence prior to discharge home.      Intensity: Minumum of 1-2 x/day, 30 to 90 minutes Frequency: 1 to 3 out of 7 days Duration/Length of Stay: 03/29/15 Treatment/Interventions: Cognitive remediation/compensation;Cueing hierarchy;Environmental controls;Internal/external aids;Functional tasks;Patient/family education;Therapeutic  Activities   Daily Session  Skilled Therapeutic Interventions: Skilled treatment session focused on cognitive goals. Upon arrival, patient was sitting on the commode and required Min guard when ambulating while attempting to furniture walk. Patient was transferred to the recliner and appeared restless in regards to constantly asking clinician to change his position while sitting up. Per chart, both the patient and his wife are concerned in regards to possible cognitive decline, therefore, SLP re-administered the MoCA to compare scores from day of evaluation. However, the evaluation could not be completed due to pain issues and need for portable chest x-ray. However, patient has only 2 subtests remaining with a possible maximum score of 14/22 points compared to 20/22 points on 2/18. Question how much of patient's function is impacted by pain, anxiety and medications. After chest X-ray, patient asked clinician why he had an x-ray of his neck and if he was going to die. RN made aware. Clinician provided emotional support. Patient left supine in bed with all needs within reach.    Function:   Cognition Comprehension Comprehension assist level: Follows basic conversation/direction with no assist  Expression   Expression assist level: Expresses basic needs/ideas: With extra time/assistive device  Social Interaction Social Interaction assist level: Interacts appropriately 50 - 74% of the time - May be physically or verbally inappropriate.  Problem Solving Problem solving assist level: Solves basic 50 - 74% of the time/requires cueing 25 - 49% of the time  Memory Memory assist level: Recognizes or recalls 50 - 74% of the time/requires cueing 25 - 49% of the time   Pain Patient reporting a headache. RN made aware and patient pre-medicated   Therapy/Group: Individual Therapy  Jeymi Hepp 03/24/2015, 11:53 AM

## 2015-03-24 NOTE — Progress Notes (Signed)
[place] PHYSICAL MEDICINE & REHABILITATION     PROGRESS NOTE  Subjective/Complaints: Pt seen laying in bed this AM.  He states he is sleepy, but feels better than he did previously.  When asked about his headaches, he notes 8/10 pain, but that it is his baseline.    ROS:  Denies nausea, diplopia, CP, SOB, N/V/D   Objective: Vital Signs: Blood pressure 127/70, pulse 86, temperature 98.6 F (37 C), temperature source Oral, resp. rate 19, weight 132.269 kg (291 lb 9.6 oz), SpO2 98 %. No results found. No results for input(s): WBC, HGB, HCT, PLT in the last 72 hours. No results for input(s): NA, K, CL, GLUCOSE, BUN, CREATININE, CALCIUM in the last 72 hours.  Invalid input(s): CO CBG (last 3)  No results for input(s): GLUCAP in the last 72 hours.  Wt Readings from Last 3 Encounters:  03/22/15 132.269 kg (291 lb 9.6 oz)  03/15/15 140.4 kg (309 lb 8.4 oz)  03/08/15 137.667 kg (303 lb 8 oz)    Physical Exam:  Constitutional: He appears well-developed and well-nourished. Vital signs reviewed HENT:  normocephalic. Surgical scar posteriorly. Bilateral external ears within normal limits Eyes: EOM and conjunctiva are normal. Cardiovascular: Normal rate and regular rhythm. no murmur Respiratory: +Wheezing. Effort normal and breath sounds normal. No respiratory distress.  GI: Soft. Bowel sounds are normal. He exhibits no distension.  Neurological: Alert Makes good eye contact with examiner.  Follow simple commands.  Motor: B/L UE: 4+/5 proximal distal with bilateral ataxia (improving), no dysmetria  B/l LE 4+/5 proximal to distal  Skin:  Surgical sight C/D/I. Otherwise warm and dry. Psych: Sleepy, appears slow at times.   Assessment/Plan: 1. Balance/gait deficits secondary to left vestibular mass which require 3+ hours per day of interdisciplinary therapy in a comprehensive inpatient rehab setting. Physiatrist is providing close team supervision and 24 hour management of active  medical problems listed below. Physiatrist and rehab team continue to assess barriers to discharge/monitor patient progress toward functional and medical goals.  Function:  Bathing Bathing position Bathing activity did not occur: Refused Position: Production manager parts bathed by patient: Right arm, Left arm, Chest, Abdomen, Front perineal area, Buttocks, Right upper leg, Left upper leg, Right lower leg, Left lower leg Body parts bathed by helper: Back  Bathing assist Assist Level: Touching or steadying assistance(Pt > 75%)      Upper Body Dressing/Undressing Upper body dressing   What is the patient wearing?: Pull over shirt/dress     Pull over shirt/dress - Perfomed by patient: Thread/unthread right sleeve, Thread/unthread left sleeve, Put head through opening, Pull shirt over trunk   Button up shirt - Perfomed by patient: Thread/unthread right sleeve, Thread/unthread left sleeve Button up shirt - Perfomed by helper: Pull shirt around back, Button/unbutton shirt    Upper body assist Assist Level: Set up   Set up : To obtain clothing/put away  Lower Body Dressing/Undressing Lower body dressing   What is the patient wearing?: Pants, Non-skid slipper socks Underwear - Performed by patient: Thread/unthread right underwear leg, Thread/unthread left underwear leg, Pull underwear up/down Underwear - Performed by helper: Pull underwear up/down Pants- Performed by patient: Thread/unthread right pants leg, Thread/unthread left pants leg, Pull pants up/down Pants- Performed by helper: Pull pants up/down   Non-skid slipper socks- Performed by helper: Don/doff left sock, Don/doff right sock                  Lower body assist Assist for lower  body dressing: Touching or steadying assistance (Pt > 75%)      Toileting Toileting Toileting activity did not occur: Refused Toileting steps completed by patient: Adjust clothing prior to toileting, Performs perineal hygiene,  Adjust clothing after toileting   Toileting Assistive Devices: Grab bar or rail  Toileting assist Assist level: Touching or steadying assistance (Pt.75%)   Transfers Chair/bed transfer   Chair/bed transfer method: Ambulatory, Stand pivot (supervision for stand pivot) Chair/bed transfer assist level: Touching or steadying assistance (Pt > 75%) Chair/bed transfer assistive device: Armrests     Locomotion Ambulation     Max distance: 150 Assist level: Supervision or verbal cues   Wheelchair          Cognition Comprehension Comprehension assist level: Follows basic conversation/direction with no assist  Expression Expression assist level: Expresses basic needs/ideas: With extra time/assistive device  Social Interaction Social Interaction assist level: Interacts appropriately 50 - 74% of the time - May be physically or verbally inappropriate.  Problem Solving Problem solving assist level: Solves basic 50 - 74% of the time/requires cueing 25 - 49% of the time  Memory Memory assist level: Recognizes or recalls 50 - 74% of the time/requires cueing 25 - 49% of the time   Medical Problem List and Plan: 1. 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  -Decadron decreased, will d/c 2/27  -Repeat CT head on 2/22, reviewed, only significant for postsurgical changes. 2. DVT Prophylaxis/Anticoagulation: SCDs. Monitor for any signs of DVT.  -mobilize 3. Pain Management: Cont prn oxycodone  4. Mood: Prozac 40 mg daily. Team/family to provide emotional support 5. Neuropsych: This patient is capable of making decisions on his own behalf. 6. Skin/Wound Care: Routine skin checks. Continue staples for now 7. Fluids/Electrolytes/Nutrition: Routine I&O's. 8. Hypertension. Lisinopril 10 mg daily, increased to 20 on 2/20.  BP improved with improvement in pain.  9. Hyperlipidemia. Pravachol 10.BPH: Flomax 0.4 mg daily. 11.  Neurogenic headaches    Continue Topamax 25, will cont current dose due to improvements in headaches  Will increase propranolol 20 on 2/24  Cont Lidoderm patch 12. Leukocytosis (improving; pt on Decadron)   10.9 on 2/20  Will cont to monitor 13. Wheezing with ?SOB  Cont albuterol PRN  Will order CXR  Will order labs  LOS (Days) 7 A FACE TO FACE EVALUATION WAS PERFORMED  Ankit Lorie Phenix 03/24/2015 9:15 AM

## 2015-03-24 NOTE — Discharge Summary (Signed)
NAMEGUERRY, PONZI NO.:  [id number]  MEDICAL RECORD NO.:  [id number]  LOCATION:  [id number]                        FACILITY:  [facility]  PHYSICIAN:  Delice Lesch, MD        DATE OF BIRTH:  1945/07/26  DATE OF ADMISSION:  03/17/2015 DATE OF DISCHARGE:  03/24/2015                              DISCHARGE SUMMARY   DISCHARGE DIAGNOSES: 1. 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 2. SCDs for deep vein thrombosis prophylaxis. 3. Pain management. 4. Depression. 5. Hypertension. 6. Hyperlipidemia. 7. Benign prostatic hyperplasia. 8. Neurogenic headaches.  HISTORY OF PRESENT ILLNESS:  This is a 70 year old right-handed male with history of hypertension, remote tobacco abuse, lives with his wife, independent prior to admission.  Admitted March 15, 2015, with incidentally discovered posterior fossa cystic mass during workup for back pain in January.  Underwent suboccipital craniectomy for tumor resection, use of stereotactic navigation March 15, 2015, per Dr. Cyndy Freeze. Diet slowly advanced.  Followup MRI negative for hydrocephalus or acute infarct, Decadron protocol as advised.  Physical and occupational therapy ongoing.  The patient was admitted for a comprehensive rehab program.  PAST MEDICAL HISTORY:  See discharge diagnoses.  SOCIAL HISTORY:  Married, lives with his wife, independent prior to admission.  FUNCTIONAL STATUS UPON ADMISSION TO REHAB SERVICES:  Minimal assist sit to stand, minimal assist for rolling, min mod max assist activities of daily living.  PHYSICAL EXAMINATION:  VITAL SIGNS:  Blood pressure 137/66, pulse 77, temperature 99, respirations 20. GENERAL:  This was an alert male, flat affect, made good eye contact with examiner.  Oriented to person, place, and date of birth. LUNGS:  Clear to auscultation without wheeze. CARDIAC:  Regular rate and rhythm without murmur. ABDOMEN:  Soft,  nontender.  Good bowel sounds. NEURO:  Craniectomy site clean and dry.  REHABILITATION HOSPITAL COURSE:  The patient was admitted to inpatient rehab services with therapies initiated on a 3-hour daily basis consisting of physical therapy, occupational therapy, and rehabilitation nursing, as well as speech therapy.  Pertaining to Mr. Llerenas's left cerebellar mass, he had undergone resection per Neurosurgery.  Decadron taper as advised.  Latest followup cranial CT scan on March 22, 2015, showed only postsurgical changes.  Bouts of headache noted, placed on Topamax as well as a Lidoderm patch with Inderal that was increased to 20 mg 3 times daily.  Blood pressures remained well controlled.  No orthostatic changes.  Bouts of constipation resolved with laxative assistance.  He did have a noted history of anxiety and depression, he was using Xanax as needed as well as maintained on Prozac 40 mg daily. The patient received weekly collaborative interdisciplinary team conferences to discuss estimated length of stay, family teaching, any barriers to his discharge.  He could ambulate household distances with a rolling walker, however, inconsistent.  Sit to stand from recliner with supervision and rolling walker.  Again, he could ambulate distances from his room to the therapy gym.  He needed some encouragement at times to participate.  Activities of daily living and homemaking.  Sit to stand from shower bench with supervision.  Ambulate handheld  assist to the bathroom and transfer to shower seat.  His wife remained on hand for full family teaching that was completed.  Initial discharge was scheduled for March 29, 2015, however, the patient and family discussed at length the request for discharge to home.  All issues in regards for safety were discussed with the patient and family, they were again quite adamant about being discharged, ongoing therapies would be arranged.  DISCHARGE  MEDICATIONS: 1. Xanax 0.5 mg p.o. t.i.d. as needed. 2. Decadron 2 mg p.o. daily x3 days then stop. 3. Prozac 40 mg p.o. daily. 4. Lidoderm patch. 5. Lisinopril 20 mg p.o. daily. 6. Robaxin 750 mg p.o. every 6 hours as needed muscle spasms. 7. Oxycodone immediate release 5-10 mg every 4 hours as needed pain,     dispense of 90 tablets. 8. Protonix 40 mg p.o. daily. 9. Pravachol 10 mg p.o. daily. 10.Inderal 20 mg p.o. t.i.d. 11.Flomax 0.4 mg daily. 12.Topamax 25 mg p.o. at bedtime.  DIET:  Regular.  FOLLOWUP:  He would follow up with Dr. Posey Pronto at the outpatient rehab center as directed; Dr. Marland Kitchen Ditty, 2 weeks call for appointment; Dr. Ramonita Lab [facility] at [facility], [place], [place].     Lauraine Rinne, P.A.   ______________________________ Delice Lesch, MD    DA/MEDQ  D:  03/24/2015  T:  03/24/2015  Job:  [id number]  cc:   Ramonita Lab, MD Cyndy Freeze, MD

## 2015-03-24 NOTE — Progress Notes (Addendum)
The RN was informed by NT that the pt. And his family had decided to go home because pt. believes that he's not getting better.RN went to talk to pt's family and explained that at the moment anything has been set up for him to go home,also the MD and social worker had left for the day.They said that regardless all those circumstances,they want to take him home immediatly.Marlowe Shores PAC was notified about their decision,via telephone.After getting the paper work and instructions, follow up appointments and prescriptions,pt. Went home with his. family.

## 2015-03-24 NOTE — Plan of Care (Signed)
Problem: RH Balance Goal: LTG Patient will maintain dynamic standing balance (PT) LTG: Patient will maintain dynamic standing balance with assistance during mobility activities (PT)  Downgraded due to pt safety  Problem: RH Bed Mobility Goal: LTG Patient will perform bed mobility with assist (PT) LTG: Patient will perform bed mobility with assistance, with/without cues (PT).  Downgraded due to pt safety  Problem: RH Bed to Chair Transfers Goal: LTG Patient will perform bed/chair transfers w/assist (PT) LTG: Patient will perform bed/chair transfers with assistance, with/without cues (PT).  Downgraded due to pt safety  Problem: RH Car Transfers Goal: LTG Patient will perform car transfers with assist (PT) LTG: Patient will perform car transfers with assistance (PT).  Downgraded due to pt safety  Problem: RH Furniture Transfers Goal: LTG Patient will perform furniture transfers w/assist (OT/PT LTG: Patient will perform furniture transfers with assistance (OT/PT).  Downgraded due to pt safety  Problem: RH Ambulation Goal: LTG Patient will ambulate in controlled environment (PT) LTG: Patient will ambulate in a controlled environment, # of feet with assistance (PT).  Downgraded due to pt safety Goal: LTG Patient will ambulate in home environment (PT) LTG: Patient will ambulate in home environment, # of feet with assistance (PT).  Downgraded due to pt safety Goal: LTG Patient will ambulate in community environment (PT) LTG: Patient will ambulate in community environment, # of feet with assistance (PT).  Downgraded due to pt safety  Problem: RH Stairs Goal: LTG Patient will ambulate up and down stairs w/assist (PT) LTG: Patient will ambulate up and down # of stairs with assistance (PT)  Downgraded due to pt safety

## 2015-03-24 NOTE — Discharge Instructions (Signed)
Inpatient Rehab Discharge Instructions  JAVANNI CALDERON Discharge date and time: No discharge date for patient encounter.   Activities/Precautions/ Functional Status: Activity: activity as tolerated Diet: regular diet Wound Care: none needed Functional status:  ___ No restrictions     ___ Walk up steps independently ___ 24/7 supervision/assistance   ___ Walk up steps with assistance ___ Intermittent supervision/assistance  ___ Bathe/dress independently ___ Walk with walker     ___ Bathe/dress with assistance ___ Walk Independently    ___ Shower independently _x__ Walk with assistance    ___ Shower with assistance ___ No alcohol     ___ Return to work/school ________  Special Instructions:    My questions have been answered and I understand these instructions. I will adhere to these goals and the provided educational materials after my discharge from the hospital.  Patient/Caregiver Signature _______________________________ Date __________  Clinician Signature _______________________________________ Date __________  Please bring this form and your medication list with you to all your follow-up doctor's appointments.

## 2015-03-24 NOTE — Progress Notes (Signed)
Physical Therapy Session Note  Patient Details  Name: Maxwell Carroll MRN: [id number] Date of Birth: 1945/02/01   Missed 24 minutes of skilled PT.  Pt c/o severe headache and declines therapy services at this time.  Pt also demos decreased memory, asking therapist multiple times where his wife was, after telling this therapist that she went downstairs to get paperwork.  PT educated pt on importance of OOB activity and that he would likely only have one weekend therapy session.  Pt continued to decline OOB.  Pt left supine with call bell in reach and needs met.  PA, Dan, notified of pt's ongoing issues with ? Anxiety, pain, and limited OOB tolerance.  RN also aware of missed therapy time.       Jaelen Gellerman E Penven-Crew 03/24/2015, 1:41 PM

## 2015-03-25 ENCOUNTER — Inpatient Hospital Stay (HOSPITAL_COMMUNITY): Payer: Self-pay | Admitting: Occupational Therapy

## 2015-03-26 ENCOUNTER — Inpatient Hospital Stay (HOSPITAL_COMMUNITY): Payer: Self-pay | Admitting: Physical Therapy

## 2015-03-27 ENCOUNTER — Inpatient Hospital Stay (HOSPITAL_COMMUNITY): Payer: Self-pay | Admitting: Speech Pathology

## 2015-03-27 ENCOUNTER — Encounter (HOSPITAL_COMMUNITY): Payer: Self-pay

## 2015-03-27 ENCOUNTER — Telehealth: Payer: Self-pay | Admitting: Physical Medicine & Rehabilitation

## 2015-03-27 ENCOUNTER — Inpatient Hospital Stay (HOSPITAL_COMMUNITY): Payer: Self-pay | Admitting: Occupational Therapy

## 2015-03-27 ENCOUNTER — Telehealth (HOSPITAL_COMMUNITY): Payer: Self-pay

## 2015-03-27 ENCOUNTER — Inpatient Hospital Stay (HOSPITAL_COMMUNITY): Payer: Self-pay | Admitting: Physical Therapy

## 2015-03-27 NOTE — Progress Notes (Signed)
Phone call to wife this morning to follow up about his unplanned d/c on Friday and to discuss needed [facility] follow up as recommended by tx team.  Wife quickly reports that she had contacted pt's primary care MD, Dr. Ramonita Lab, and had made a referral to Hospice.  Notes "they're (Hospice) going to handle everything."  Wife then states she is waiting for another call and needs to get off of the phone.  States she will call me back, however, doubtful.  If I do speak with her again, I will attempt to clarify Hospice involvement (?).  Have alerted Marlowe Shores, PA of conversation.  Wasyl Dornfeld, LCSW

## 2015-03-27 NOTE — Progress Notes (Signed)
Occupational Therapy Discharge Summary  Patient Details  Name: Maxwell Carroll MRN: [id number] Date of Birth: 04-12-1945     Patient has met 4 of 8 long term goals due to improved activity tolerance, improved balance and ability to compensate for deficits.  Patient to discharge at [facility] Assist level.  Patient's care partner is independent to provide the necessary physical assistance at discharge.    Reasons goals not met: Pt. Left rehab per family request 6 days before day of discharge.    Recommendation:  Patient will benefit from ongoing skilled OT services in home health setting to continue to advance functional skills in the area of BADL, iADL and Reduce care partner burden.  Equipment: No equipment provided  Reasons for discharge: change in medical status and discharge from hospital  Patient/family agrees with progress made and goals achieved: No  OT Discharge Precautions/Restrictions  Precautions Precautions: Fall Restrictions Weight Bearing Restrictions: No     Pain  8/10 headache      Vision/Perception  Vision- Assessment Eye Alignment: Impaired (comment) Ocular Range of Motion: Within Functional Limits Diplopia Assessment: Objects split side to side Perception Perception: Impaired  Cognition Overall Cognitive Status: Impaired/Different from baseline Arousal/Alertness: Awake/alert Orientation Level: Oriented to person;Oriented to place;Oriented to time;Oriented to situation Attention: Selective Selective Attention: Appears intact Memory: Impaired Memory Impairment: Decreased recall of new information Awareness: Appears intact Problem Solving: Impaired Problem Solving Impairment: Functional complex Executive Function: Decision Making Behaviors: Lability Safety/Judgment: Appears intact Sensation Sensation Light Touch: Appears Intact Coordination Gross Motor Movements are Fluid and Coordinated: Yes Fine Motor Movements are Fluid and Coordinated:  Yes Motor  Motor Motor - Skilled Clinical Observations: mild L sided ataxia, patient compensates with slow cautious movements Mobility  Bed Mobility Bed Mobility: Supine to Sit;Sit to Supine Rolling Right: 7: Independent;6: Modified independent (Device/Increase time) Rolling Left: 6: Modified independent (Device/Increase time) Supine to Sit: 5: Supervision;HOB flat;With rails Sit to Supine: 5: Supervision;HOB flat;With rail Transfers Sit to Stand: 4: Min assist Sit to Stand Details: Verbal cues for gait pattern;Verbal cues for precautions/safety Stand to Sit: 4: Min guard  Trunk/Postural Assessment  Cervical Assessment Cervical Assessment: Within Functional Limits Cervical AROM Overall Cervical AROM: Deficits Overall Cervical AROM Comments: keeps head as still as possible, tolerates very little rotation to R and L Thoracic Assessment Thoracic Assessment: Within Functional Limits Lumbar Assessment Lumbar Assessment: Exceptions to [facility] Postural Control Postural Control: Deficits on evaluation Protective Responses: delayed/impaired  Balance Balance Balance Assessed: Yes Static Sitting Balance Static Sitting - Balance Support: Feet supported Static Sitting - Level of Assistance: 6: Modified independent (Device/Increase time) Static Standing Balance Static Standing - Balance Support: During functional activity;No upper extremity supported Static Standing - Level of Assistance: 5: Stand by assistance Dynamic Standing Balance Dynamic Standing - Balance Support: During functional activity;No upper extremity supported Dynamic Standing - Level of Assistance: 4: Min assist Extremity/Trunk Assessment RUE Assessment RUE Assessment: Within Functional Limits LUE Assessment LUE Assessment: Exceptions to [facility] LUE Strength LUE Overall Strength: Deficits   See Function Navigator for Current Functional Status.  Lisa Roca 03/27/2015, 5:15 PM

## 2015-03-27 NOTE — Progress Notes (Signed)
Speech Language Pathology Discharge Summary  Patient Details  Name: Maxwell Carroll MRN: [id number] Date of Birth: Jun 16, 1945  Patient has met 0 of 3 long term goals.  Patient to discharge at overall Min;Mod level.   Reasons goals not met: Patient limited by pain and discharged home earlier than planned per family request    Clinical Impression/Discharge Summary: Patient has made minimal and inconsistent gains and did not meet any LTG's this admission. Currently, patient requires overall Min-Mod A multimodal cues to complete functional and familiar tasks safely due to decreased attention, recall and functional problem solving. Patient continues to be limited by pain, restlessness and anxiety and question how much medication may be impacting patient's overall cognitive function. SLP re-administered the MoCA on 2/24 to compare scores from day of evaluation. However, the evaluation could not be completed due to pain issues and need for portable chest x-ray. However, patient had only 2 subtests remaining with a possible maximum score of 14/22 points compared to 20/22 points on 2/18. Patient and family requesting to discharge home earlier than planned, therefore, no formal education was given by this clinician in regards to his cognitive function. Patient discharged home with 24 hour supervision and f/u SLP services are recommended to maximize cognitive function and overall functional independence in order to reduce caregiver burden.   Care Partner:  Caregiver Able to Provide Assistance: Yes  Type of Caregiver Assistance: Physical;Cognitive  Recommendation:  24 hour supervision/assistance;Home Health SLP  Rationale for SLP Follow Up: Maximize cognitive function and independence;Reduce caregiver burden   Equipment: N/A    Reasons for discharge: Discharged from hospital   Patient/Family Agrees with Progress Made and Goals Achieved: No     Robbert Langlinais 03/27/2015, 4:02 PM

## 2015-03-27 NOTE — Telephone Encounter (Signed)
Called to initiate a transitional care call. Spoke with Maxwell Carroll the wife who is expecting a call from Dr. Olin Pia office or Hospice. She states that her husband slipped into a coma yesterday and that the EMTs were called out to the residence. She declined my offer to have EMS come out to the house to get her husband and bring him to [facility]. She states her husband is DNR and with medical power of attorney she wants him at home with her and nothing to extend his life is to be done. She is wanting Hospice. She apologized for her behavior Friday on the floor.  I made sure she understood and repeated our phone number back that if she needed anything she was to call our office for all NON-emergent help. I told her I would call back tomorrow to check on Hospice.

## 2015-03-27 NOTE — Progress Notes (Signed)
Physical Therapy Discharge Summary  Patient Details  Name: Maxwell Carroll MRN: [id number] Date of Birth: 08-Aug-1945  Patient has met 6 of 10 long term goals due to improved balance and increased strength.  Patient to discharge at an ambulatory level supervision>min assist.   Patient and family requested to d/c before anticipated d/c date therefore no formal family education regarding mobility occurred.    Reasons goals not met: Pt and family requested to d/c early.  Pt continues to require min assist for all transfers due to decreased balance.  Pt also requires min assist when not ambulating with AD, but is supervision when using RW.    Recommendation:  Patient will benefit from ongoing skilled PT services in home health setting to continue to advance safe functional mobility, address ongoing impairments in strength, balance, activity tolerance, ROM, and cognition, and minimize fall risk.  Equipment: No equipment provided  Reasons for discharge: discharge from hospital  Patient/family agrees with progress made and goals achieved: Yes, family and pt requesting to d/c earlier than planned, despite pt continuing to require min assist for some mobility.   PT Discharge Pt requesting to d/c early from inpatient rehab.  Pt continues to require min assist for transfers and gait without an AD due to ongoing balance deficits.  Pt continues to demonstrate decreased AROM for cervical rotation bilaterally and decreased volitional rotation during daily activities.    See Function Navigator for Current Functional Status.  Anila Bojarski E Penven-Crew 03/27/2015, 4:58 PM

## 2015-03-27 NOTE — Plan of Care (Signed)
Problem: RH Balance Goal: LTG Patient will maintain dynamic sitting balance (PT) LTG: Patient will maintain dynamic sitting balance with assistance during mobility activities (PT)  Outcome: Not Met (add Reason) Pt requested to d/c early.   Problem: RH Bed to Chair Transfers Goal: LTG Patient will perform bed/chair transfers w/assist (PT) LTG: Patient will perform bed/chair transfers with assistance, with/without cues (PT).  Outcome: Not Met (add Reason) Pt requested to d/c early.   Problem: RH Car Transfers Goal: LTG Patient will perform car transfers with assist (PT) LTG: Patient will perform car transfers with assistance (PT).  Outcome: Not Met (add Reason) Pt requested to d/c early.   Problem: RH Furniture Transfers Goal: LTG Patient will perform furniture transfers w/assist (OT/PT LTG: Patient will perform furniture transfers with assistance (OT/PT).  Outcome: Not Met (add Reason) Pt requested to d/c early.

## 2015-03-27 NOTE — Progress Notes (Signed)
Social Work  Discharge Note  The overall goal for the admission was met for:   Discharge location: Yes - home with wife who will provide 24/7 assistance.  Length of Stay: No - pt and wife insist on d/c 2/24 prior to originally targeted 3/1 d/c date.  Telling PA he will do better at home.  Discharge activity level: No - not all goals met due to early d/c  Home/community participation: No  Services provided included: MD, RD, PT, OT, SLP, RN, TR, Pharmacy and SW  Financial Services: Private Insurance: [facility] Medicare  Follow-up services arranged: no services yet in place at time of d/c as this change in plan per pt and wife wishes took place after this SW had left for the day.  Pt/wife aware I would follow up with them on Monday about d/c needs.  Comments (or additional information):  Patient/Family verbalized understanding of follow-up arrangements: Yes  Individual responsible for coordination of the follow-up plan: pt  Confirmed correct DME delivered: Savayah Waltrip 03/24/2015    Hayleigh Bawa

## 2015-03-29 NOTE — Plan of Care (Signed)
Problem: RH Balance Goal: LTG Patient will maintain dynamic standing with ADLs (OT) LTG: Patient will maintain dynamic standing balance with assist during activities of daily living (OT)  Outcome: Not Met (add Reason) Pt discharged from inpatient rehab against medical advice.  Problem: RH Bathing Goal: LTG Patient will bathe with assist, cues/equipment (OT) LTG: Patient will bathe specified number of body parts with assist with/without cues using equipment (position) (OT)  Outcome: Not Met (add Reason) Pt discharged from inpatient rehab against medical advice.  Problem: RH Dressing Goal: LTG Patient will perform upper body dressing (OT) LTG Patient will perform upper body dressing with assist, with/without cues (OT).  Outcome: Not Met (add Reason) Pt discharged from inpatient rehab against medical advice. Goal: LTG Patient will perform lower body dressing w/assist (OT) LTG: Patient will perform lower body dressing with assist, with/without cues in positioning using equipment (OT)  Outcome: Not Met (add Reason) Pt discharged from inpatient rehab against medical advice.  Problem: RH Toileting Goal: LTG Patient will perform toileting w/assist, cues/equip (OT) LTG: Patient will perform toiletiing (clothes management/hygiene) with assist, with/without cues using equipment (OT)  Outcome: Not Met (add Reason) Pt discharged from inpatient rehab against medical advice.  Problem: RH Vision Goal: RH LTG Vision (Specify) Outcome: Not Met (add Reason) Pt discharged from inpatient rehab against medical advice.  Problem: RH Simple Meal Prep Goal: LTG Patient will perform simple meal prep w/assist (OT) LTG: Patient will perform simple meal prep with assistance, with/without cues (OT).  Outcome: Not Met (add Reason) Pt discharged from inpatient rehab against medical advice.  Problem: RH Laundry Goal: LTG Patient will perform laundry w/assist, cues (OT) LTG: Patient will perform laundry with  assistance, with/without cues (OT).  Outcome: Not Met (add Reason) Pt discharged from inpatient rehab against medical advice.  Problem: RH Light Housekeeping Goal: LTG Patient will perform light housekeeping w/assist (OT) LTG: Patient will perform light housekeeping with assistance, with/without cues (OT).  Outcome: Not Met (add Reason) Pt discharged from inpatient rehab against medical advice.  Problem: RH Toilet Transfers Goal: LTG Patient will perform toilet transfers w/assist (OT) LTG: Patient will perform toilet transfers with assist, with/without cues using equipment (OT)  Outcome: Not Met (add Reason) Pt discharged from inpatient rehab against medical advice.  Problem: RH Tub/Shower Transfers Goal: LTG Patient will perform tub/shower transfers w/assist (OT) LTG: Patient will perform tub/shower transfers with assist, with/without cues using equipment (OT)  Outcome: Not Met (add Reason) Pt discharged from inpatient rehab against medical advice.

## 2015-03-29 DEATH — deceased

## 2015-03-30 ENCOUNTER — Encounter (HOSPITAL_COMMUNITY): Payer: Self-pay

## 2015-03-30 NOTE — Telephone Encounter (Signed)
Called and spoke with wife Maxwell Carroll again this morning. I was out sick 05/21/2022 and Wednesday and was unable to call back until this morning. I called to check on Maxwell Carroll and see if hospice was able to come into the home.  Maxwell Carroll said her husband passed away 2022/05/21 morning.  I gave her my condolences and told her if there was anything we could do for her to call. She stated her daughter was with her and she was ok.

## 2017-12-20 IMAGING — CT CT HEAD W/O CM
1 series · 16 of 30 positions shown, 20 images · non-contrast
Comparison: 03/16/2015

CLINICAL DATA: Severe headaches, recent cerebellar surgery

EXAM:
CT HEAD WITHOUT CONTRAST
TECHNIQUE: Contiguous axial images were obtained from the base of the skull
through the vertex without intravenous contrast.

[Series 2: head 5.0 h30s · axial · 0.49mm/px · z∈[-230,-65]mm · 16 of 37 slices shown, 20 images]
[im 2/37  brain]
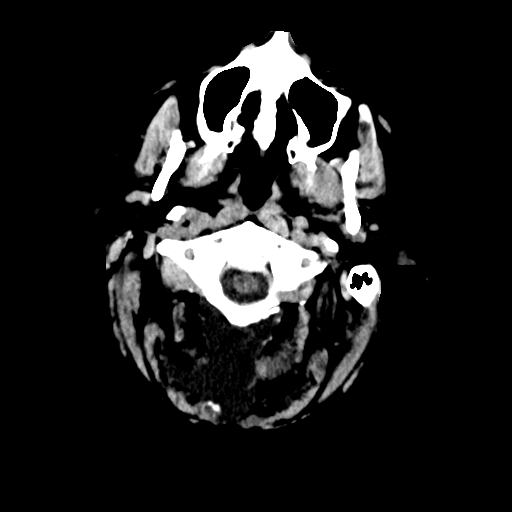
[im 2/37  bone]
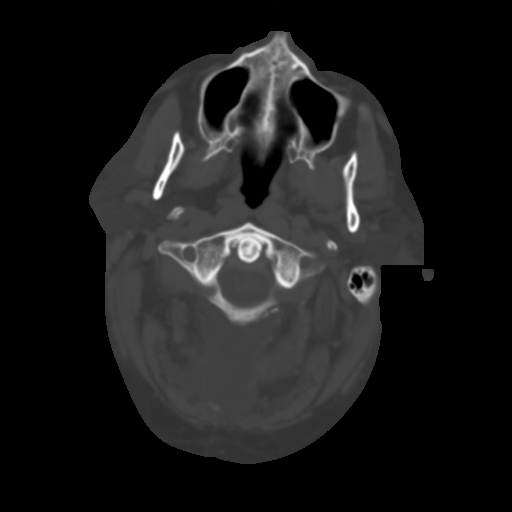
[im 4/37  brain]
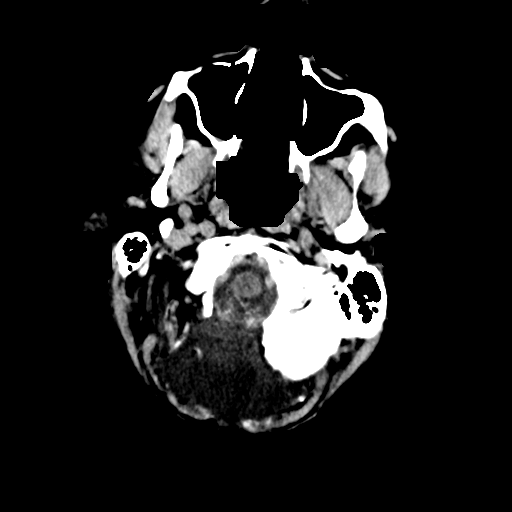
[im 7/37  brain]
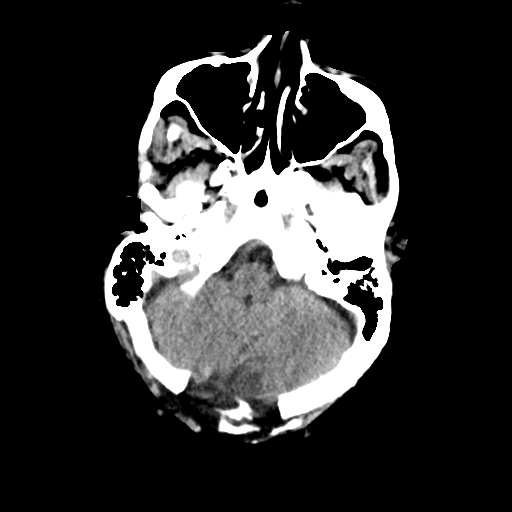
[im 9/37  brain]
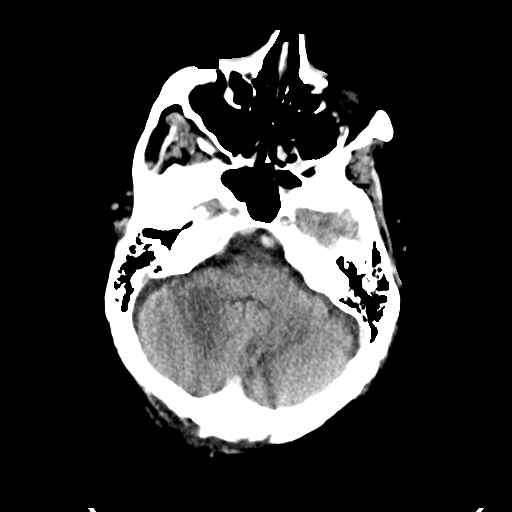
[im 10/37  brain]
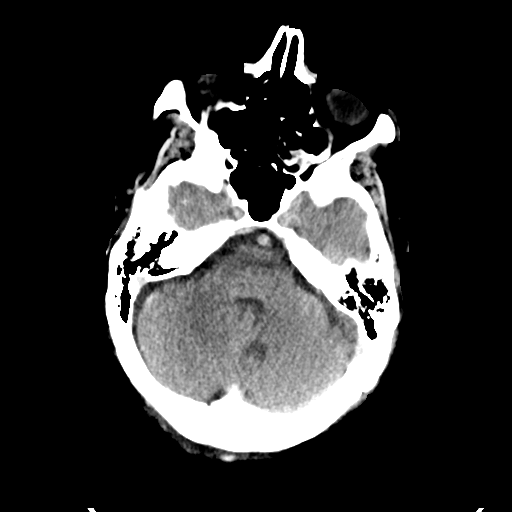
[im 10/37  bone]
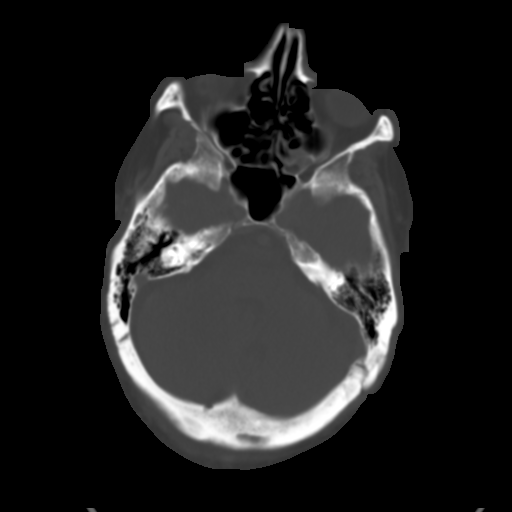
[im 13/37  brain]
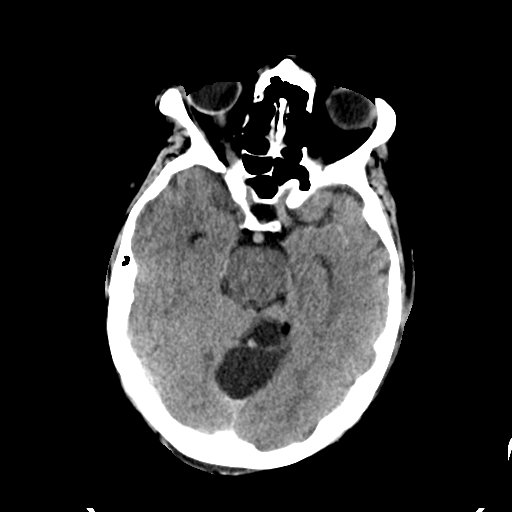
[im 15/37  brain]
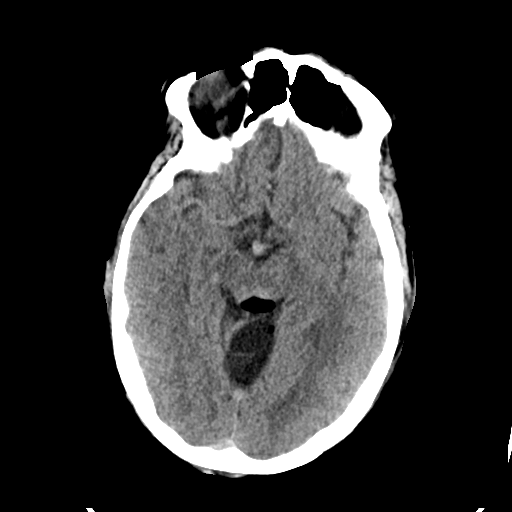
[im 18/37  brain]
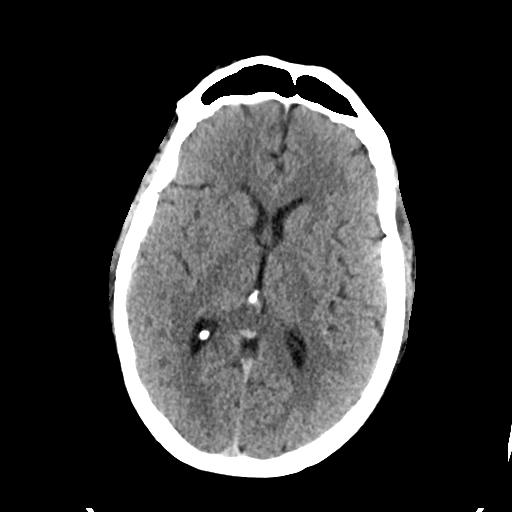
[im 19/37  brain]
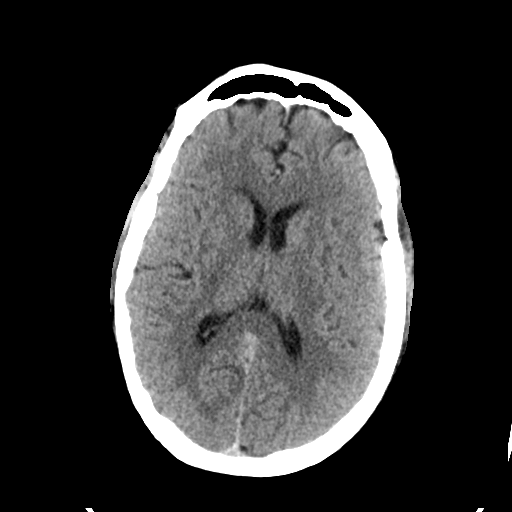
[im 19/37  bone]
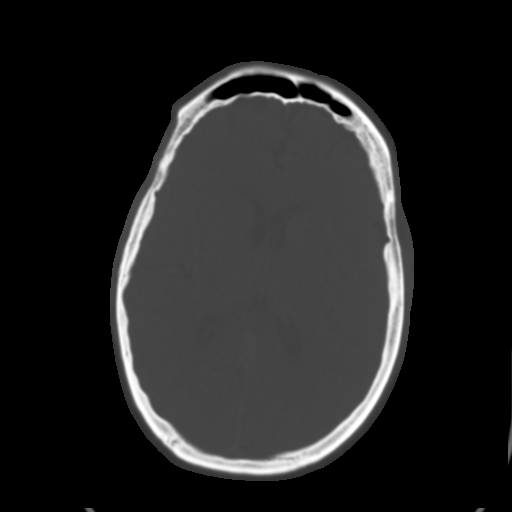
[im 22/37  brain]
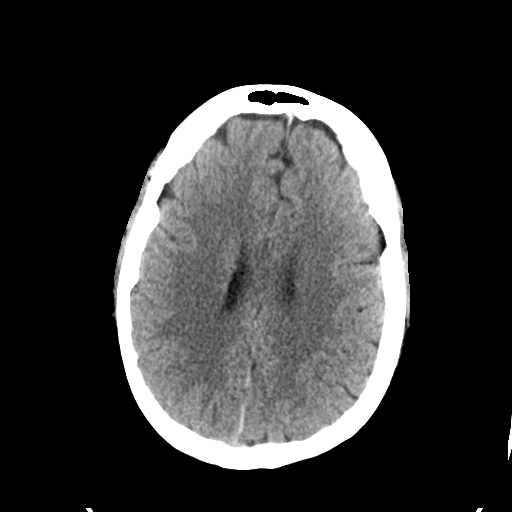
[im 24/37  brain]
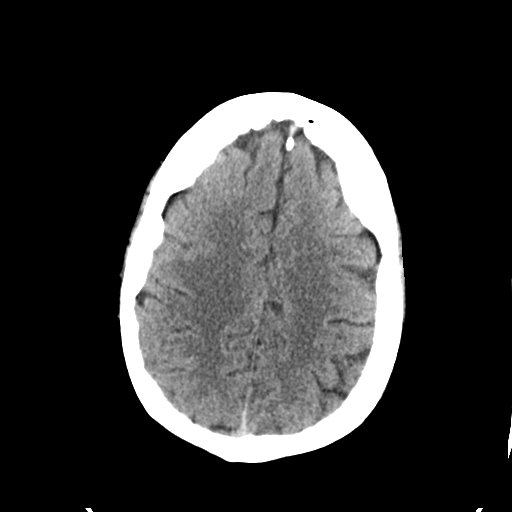
[im 27/37  brain]
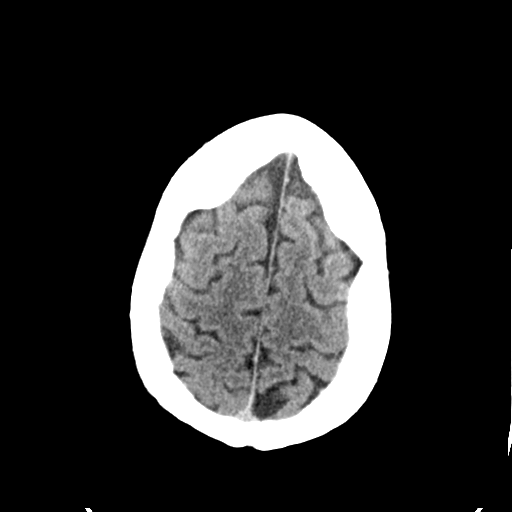
[im 28/37  brain]
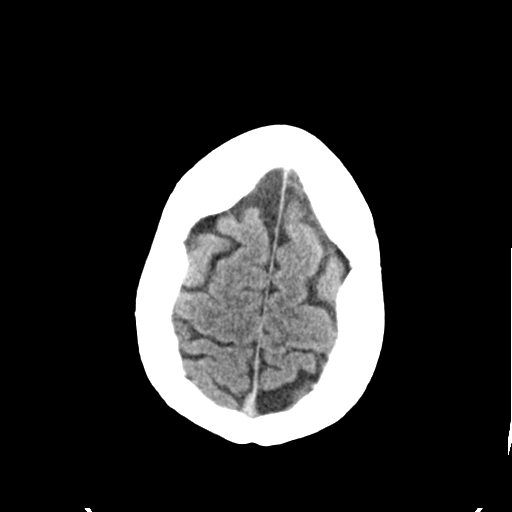
[im 28/37  bone]
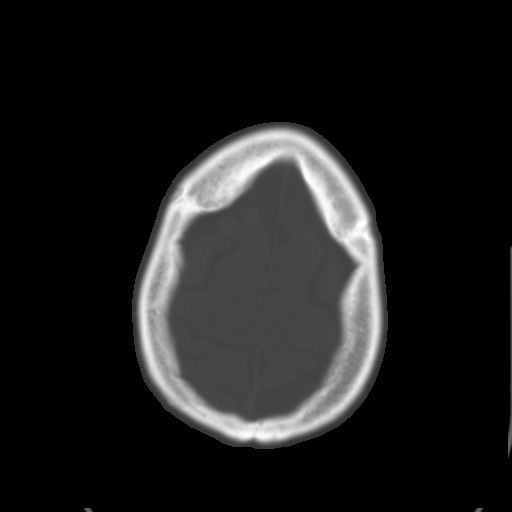
[im 30/37  brain]
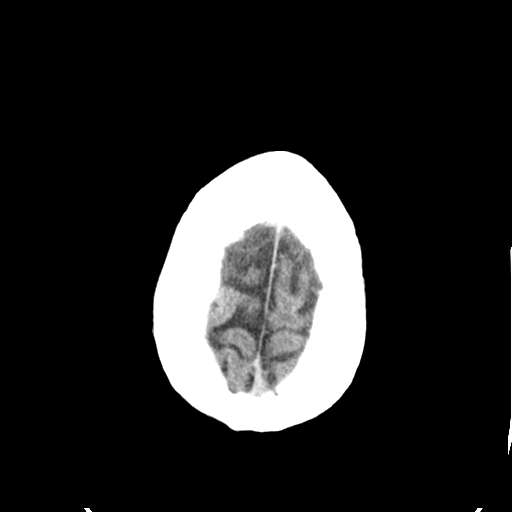
[im 33/37  brain]
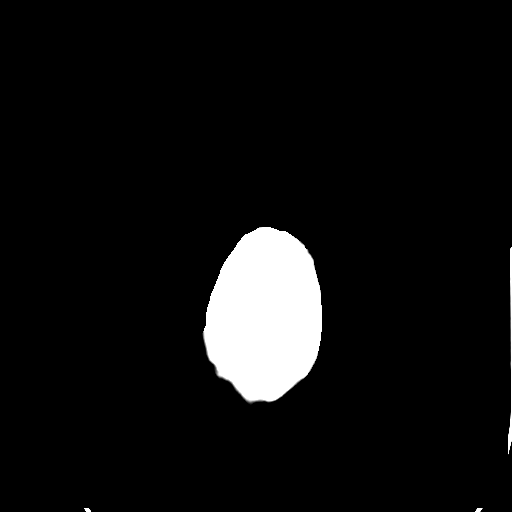
[im 35/37  brain]
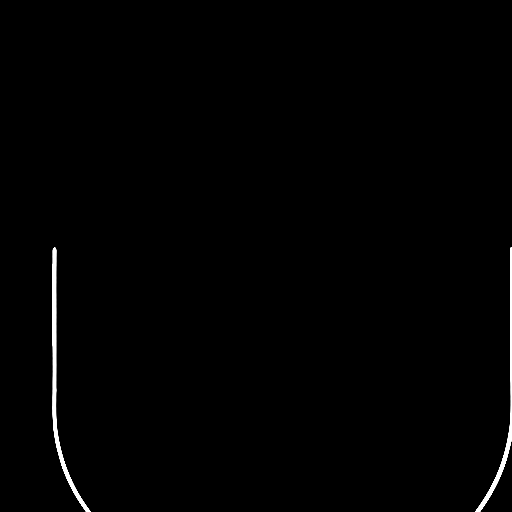

[16 of 30 positions shown; findings below may reference images not displayed]

FINDINGS: Postoperative changes are noted in the occiput put consistent with
the given clinical history. Some air is noted in the nondependent
portion of the surgical bed. Fluid is again noted within the
surgical bed. It measures approximately 4.0 by 2.2 cm. It is roughly
stable from the prior exam. Fluid collection is noted posterior and
inferior to the ox but consistent with postoperative change and
stable from the recent MR. No acute hemorrhage is seen. No
space-occupying mass lesion is noted.
IMPRESSION: Postsurgical changes similar to that seen on recent MRI. No acute
abnormality is noted.
